# Patient Record
Sex: Male | Born: 1955 | Race: White | Hispanic: No | Marital: Married | Smoking: Former smoker
Health system: Southern US, Community
[De-identification: ages and names within clinical notes are randomized; demographics above are authoritative.]

## PROBLEM LIST (undated history)

## (undated) DIAGNOSIS — B1920 Unspecified viral hepatitis C without hepatic coma: Secondary | ICD-10-CM

## (undated) DIAGNOSIS — K219 Gastro-esophageal reflux disease without esophagitis: Secondary | ICD-10-CM

## (undated) DIAGNOSIS — T7840XA Allergy, unspecified, initial encounter: Secondary | ICD-10-CM

## (undated) DIAGNOSIS — K5190 Ulcerative colitis, unspecified, without complications: Secondary | ICD-10-CM

## (undated) HISTORY — PX: KNEE ARTHROSCOPY: SUR90

## (undated) HISTORY — DX: Allergy, unspecified, initial encounter: T78.40XA

## (undated) HISTORY — DX: Ulcerative colitis, unspecified, without complications: K51.90

## (undated) HISTORY — PX: SHOULDER SURGERY: SHX246

## (undated) HISTORY — DX: Unspecified viral hepatitis C without hepatic coma: B19.20

## (undated) HISTORY — DX: Gastro-esophageal reflux disease without esophagitis: K21.9

## (undated) HISTORY — PX: COLONOSCOPY: SHX174

---

## 1999-12-14 ENCOUNTER — Encounter: Payer: Self-pay | Admitting: Gastroenterology

## 1999-12-14 ENCOUNTER — Ambulatory Visit (HOSPITAL_COMMUNITY): Admission: RE | Admit: 1999-12-14 | Discharge: 1999-12-14 | Payer: Self-pay | Admitting: Gastroenterology

## 2000-05-10 DIAGNOSIS — K5190 Ulcerative colitis, unspecified, without complications: Secondary | ICD-10-CM

## 2000-05-10 HISTORY — DX: Ulcerative colitis, unspecified, without complications: K51.90

## 2001-01-12 ENCOUNTER — Ambulatory Visit (HOSPITAL_COMMUNITY): Admission: RE | Admit: 2001-01-12 | Discharge: 2001-01-12 | Payer: Self-pay | Admitting: Gastroenterology

## 2003-08-26 ENCOUNTER — Emergency Department (HOSPITAL_COMMUNITY): Admission: EM | Admit: 2003-08-26 | Discharge: 2003-08-26 | Payer: Self-pay | Admitting: Emergency Medicine

## 2005-03-29 ENCOUNTER — Ambulatory Visit: Payer: Self-pay | Admitting: Gastroenterology

## 2008-02-13 ENCOUNTER — Ambulatory Visit: Payer: Self-pay | Admitting: Gastroenterology

## 2008-03-05 ENCOUNTER — Ambulatory Visit: Payer: Self-pay | Admitting: Gastroenterology

## 2012-02-04 ENCOUNTER — Telehealth: Payer: Self-pay | Admitting: Gastroenterology

## 2012-02-04 NOTE — Telephone Encounter (Signed)
Pt states that for the past 3 weeks he has had rectal bleeding. States that right after he eats he has to run to the bathroom. Pt had a bowel movement today and states the toilet bowl was colored red. States he is scared and wants to be seen sooner than 1st available. Pt scheduled to see Mike Gip PA Monday 02/07/12 @9am . Pt aware of appt date and time.

## 2012-02-07 ENCOUNTER — Ambulatory Visit (INDEPENDENT_AMBULATORY_CARE_PROVIDER_SITE_OTHER): Payer: BC Managed Care – PPO | Admitting: Physician Assistant

## 2012-02-07 ENCOUNTER — Encounter: Payer: Self-pay | Admitting: Gastroenterology

## 2012-02-07 ENCOUNTER — Other Ambulatory Visit (INDEPENDENT_AMBULATORY_CARE_PROVIDER_SITE_OTHER): Payer: BC Managed Care – PPO

## 2012-02-07 ENCOUNTER — Encounter: Payer: Self-pay | Admitting: Physician Assistant

## 2012-02-07 VITALS — BP 98/70 | HR 80 | Ht 66.5 in | Wt 167.4 lb

## 2012-02-07 DIAGNOSIS — K5190 Ulcerative colitis, unspecified, without complications: Secondary | ICD-10-CM

## 2012-02-07 DIAGNOSIS — B1920 Unspecified viral hepatitis C without hepatic coma: Secondary | ICD-10-CM

## 2012-02-07 DIAGNOSIS — R109 Unspecified abdominal pain: Secondary | ICD-10-CM

## 2012-02-07 DIAGNOSIS — K625 Hemorrhage of anus and rectum: Secondary | ICD-10-CM

## 2012-02-07 LAB — COMPREHENSIVE METABOLIC PANEL
ALT: 47 U/L (ref 0–53)
BUN: 15 mg/dL (ref 6–23)
CO2: 27 mEq/L (ref 19–32)
Calcium: 9.3 mg/dL (ref 8.4–10.5)
Chloride: 106 mEq/L (ref 96–112)
Creatinine, Ser: 1.1 mg/dL (ref 0.4–1.5)
GFR: 76.7 mL/min (ref >60.00)
Glucose, Bld: 97 mg/dL (ref 70–99)
Total Bilirubin: 0.7 mg/dL (ref 0.3–1.2)

## 2012-02-07 LAB — HIGH SENSITIVITY CRP: CRP, High Sensitivity: 0.48 mg/dL (ref 0.000–5.000)

## 2012-02-07 LAB — CBC WITH DIFFERENTIAL/PLATELET
Basophils Absolute: 0 10*3/uL (ref 0.0–0.1)
Basophils Relative: 0.4 % (ref 0.0–3.0)
Eosinophils Relative: 5.8 % — ABNORMAL HIGH (ref 0.0–5.0)
HCT: 48 % (ref 39.0–52.0)
Hemoglobin: 16.1 g/dL (ref 13.0–17.0)
Lymphocytes Relative: 23 % (ref 12.0–46.0)
Lymphs Abs: 2.2 10*3/uL (ref 0.7–4.0)
Monocytes Relative: 8.5 % (ref 3.0–12.0)
Neutro Abs: 5.9 10*3/uL (ref 1.4–7.7)
RBC: 5.14 Mil/uL (ref 4.22–5.81)
RDW: 13.1 % (ref 11.5–14.6)
WBC: 9.5 10*3/uL (ref 4.5–10.5)

## 2012-02-07 MED ORDER — DICYCLOMINE HCL 10 MG PO CAPS: ORAL_CAPSULE | ORAL | Status: DC

## 2012-02-07 MED ORDER — MOVIPREP 100 G PO SOLR: 1.0000 | Freq: Once | ORAL | Status: AC

## 2012-02-07 NOTE — Progress Notes (Signed)
I agree with the plan outlined in this note 

## 2012-02-07 NOTE — Progress Notes (Signed)
Subjective:    Patient ID: Anthony Buck, male    DOB: 1955/08/25, 56 y.o.   MRN: [id number]  HPI Anthony Buck is a pleasant 56 year old white male new to [facility] today. He currently does not have a primary care physician, had been seen at [facility] family practice in the past. He relates a previous colonoscopy done in 2002 and thought that Dr. Arlyce Dice may have done this. We have obtained copies of those records and he had colonoscopy in 2002 by Dr. Ewing Schlein for complaints of rectal bleeding and abdominal discomfort and concerns for ulcerative colitis. Biopsies from that procedure showed unremarkable small bowel biopsies biopsies from the right and descending colon showed unremarkable colonic mucosa with no active or chronic inflammation and then the third set of colon biopsies reads chronic active gastritis consistent with inflammatory bowel disease. Further details from those biopsies show diffuse chronic colitis. At the time of the colonoscopy there was left-sided moderate inflammation noted. The patient does not remember what medication he was given nor how long he took medications but says his symptoms cleared and he has not had any issues since then. His current symptoms started about 3 months ago and have progressed somewhat. He says he feels fatigued and has had ongoing problems with abdominal cramping and urgency and left-sided abdominal discomfort. His appetite has been fine but he thinks his weight is down a few pounds. He has not had any fever or chills. He is now having several bowel movements per day up to 10 per day sometimes very small volume and most of his bowel movements are diarrheal and contain bright red blood. He had not been on any new medications or antibiotics though he had taken some supplements earlier in the summer which she has since stopped.  Patient also relates that he has been told that he has hepatitis C Apparently he did have acute hepatitis while he was in the [employer] in late  70s and then was diagnosis hepatitis C in 1998 when he was turned down for life insurance.  Last had labs done about 3 years ago and did have one appointment with the hepatitis C clinic here in [place] but decided at that time not to go ahead with treatment because of all of the potential side effects. He was concerned that he may not be able to work during this period of time and could not afford to the treatment affect his work.    Review of Systems  Constitutional: Positive for fatigue.  HENT: Negative.   Eyes: Negative.   Respiratory: Negative.   Cardiovascular: Negative.   Gastrointestinal: Positive for abdominal pain, diarrhea and blood in stool.  Genitourinary: Negative.   Musculoskeletal: Negative.   Neurological: Negative.   Hematological: Negative.   Psychiatric/Behavioral: Negative.     Outpatient Encounter Prescriptions as of 02/07/2012  Medication Sig Dispense Refill  . loratadine (CLARITIN) 10 MG tablet Take 10 mg by mouth daily.      . Multiple Vitamin (MULTIVITAMIN) tablet Take 1 tablet by mouth daily.      Marland Kitchen dicyclomine (BENTYL) 10 MG capsule Take 1 tab 3 times daily as needed for cramping and spasms.  90 capsule  1  . MOVIPREP 100 G SOLR Take 1 kit (100 g total) by mouth once.  1 kit  0     Allergies  Allergen Reactions  . Codeine Nausea Only    Patient Active Problem List  Diagnosis  . Hepatitis C   History   Social History  .  Marital Status: Single    Spouse Name: N/A    Number of Children: 3  . Years of Education: N/A   Occupational History  . insurance sales    Social History Main Topics  . Smoking status: Never Smoker   . Smokeless tobacco: Never Used  . Alcohol Use: No     rarely  . Drug Use: No  . Sexually Active: Not on file   Other Topics Concern  . Not on file   Social History Narrative  . No narrative on file       Objective:   Physical Exam well-developed white male in no acute distress, pleasant blood pressure 98/70  pulse 80 height 5 foot 6 weight 167. HEENT; nontraumatic normocephalic EOMI PERRLA sclera anicteric,Neck; Supple no JVD, Cardiovascular; regular rate and rhythm with S1-S2 no murmur or gallop, Pulmonary; clear bilaterally, Abdomen; soft bowel sounds are active he is tender in the left upper left mid and left lower quadrant no guarding no rebound no palpable mass or hepatosplenomegaly, Rectal; exam not done, Extremities; no clubbing cyanosis or edema skin warm and dry, Psych; mood and affect normal and appropriate.        Assessment & Plan:  #32  56 year old male with 2-3 month history of abdominal cramping left-sided abdominal pain diarrhea and rectal bleeding most consistent with reactivation of ulcerative colitis. Interestingly patient apparently has not had any symptoms over the past 11 years, since last colonoscopy. #2 apparent history of hepatitis C not treated  Plan; we'll check CBC with differential  CMET, CRP and hepatitis C DNA PCR Quant today Schedule for colonoscopy with Dr. Christella Hartigan as soon as possible, procedure was discussed in detail with the patient and he is agreeable to proceed. He may continue Imodium as needed, and we'll send a prescription for Bentyl 10 mg 3 times daily as needed for cramping Will hold off on any other treatment until colonoscopy. We did discuss treatment for his hepatitis C, and he would like to be referred again to the hepatitis C clinic. I told him he may need to travel to [facility] to be seen in the hepatitis C clinic there and he is willing to do so. Will wait for labs and results of colonoscopy, and if appropriate get him referred to the hepatitis C clinic.

## 2012-02-07 NOTE — Patient Instructions (Addendum)
Please go to the basement level to have your labs drawn.  We scheduled the colonoscopy with Dr Rob Bunting for tomorrow 02-08-2012. We printed prescriptions for the Moviprep and Bentyl ( Dicyclomine).

## 2012-02-08 ENCOUNTER — Encounter: Payer: Self-pay | Admitting: Gastroenterology

## 2012-02-08 ENCOUNTER — Encounter: Payer: Self-pay | Admitting: Physician Assistant

## 2012-02-08 ENCOUNTER — Ambulatory Visit (AMBULATORY_SURGERY_CENTER): Payer: BC Managed Care – PPO | Admitting: Gastroenterology

## 2012-02-08 VITALS — BP 142/89 | HR 74 | Temp 98.2°F | Resp 17 | Ht 66.5 in | Wt 167.0 lb

## 2012-02-08 DIAGNOSIS — K5289 Other specified noninfective gastroenteritis and colitis: Secondary | ICD-10-CM

## 2012-02-08 DIAGNOSIS — D126 Benign neoplasm of colon, unspecified: Secondary | ICD-10-CM

## 2012-02-08 DIAGNOSIS — K529 Noninfective gastroenteritis and colitis, unspecified: Secondary | ICD-10-CM

## 2012-02-08 DIAGNOSIS — K625 Hemorrhage of anus and rectum: Secondary | ICD-10-CM

## 2012-02-08 DIAGNOSIS — R109 Unspecified abdominal pain: Secondary | ICD-10-CM

## 2012-02-08 LAB — HEPATITIS C RNA QUANTITATIVE: HCV Quantitative Log: 5.98 {Log} — ABNORMAL HIGH (ref <1.18)

## 2012-02-08 MED ORDER — PREDNISONE 10 MG PO TABS: 40.0000 mg | ORAL_TABLET | Freq: Every day | ORAL | Status: DC

## 2012-02-08 MED ORDER — SODIUM CHLORIDE 0.9 % IV SOLN: 500.0000 mL | INTRAVENOUS | Status: DC

## 2012-02-08 NOTE — Progress Notes (Signed)
Very poor prep, incomplete exam

## 2012-02-08 NOTE — Patient Instructions (Addendum)
Discharge instructions given with verbal understanding. Handout on ulcerative Colitis given. Resume previous medications. YOU HAD AN ENDOSCOPIC PROCEDURE TODAY AT THE [facility]: Refer to the procedure report that was given to you for any specific questions about what was found during the examination.  If the procedure report does not answer your questions, please call your gastroenterologist to clarify.  If you requested that your care partner not be given the details of your procedure findings, then the procedure report has been included in a sealed envelope for you to review at your convenience later.  YOU SHOULD EXPECT: Some feelings of bloating in the abdomen. Passage of more gas than usual.  Walking can help get rid of the air that was put into your GI tract during the procedure and reduce the bloating. If you had a lower endoscopy (such as a colonoscopy or flexible sigmoidoscopy) you may notice spotting of blood in your stool or on the toilet paper. If you underwent a bowel prep for your procedure, then you may not have a normal bowel movement for a few days.  DIET: Your first meal following the procedure should be a light meal and then it is ok to progress to your normal diet.  A half-sandwich or bowl of soup is an example of a good first meal.  Heavy or fried foods are harder to digest and may make you feel nauseous or bloated.  Likewise meals heavy in dairy and vegetables can cause extra gas to form and this can also increase the bloating.  Drink plenty of fluids but you should avoid alcoholic beverages for 24 hours.  ACTIVITY: Your care partner should take you home directly after the procedure.  You should plan to take it easy, moving slowly for the rest of the day.  You can resume normal activity the day after the procedure however you should NOT DRIVE or use heavy machinery for 24 hours (because of the sedation medicines used during the test).    SYMPTOMS TO REPORT  IMMEDIATELY: A gastroenterologist can be reached at any hour.  During normal business hours, 8:30 AM to 5:00 PM Monday through Friday, call [phone].  After hours and on weekends, please call the GI answering service at [phone] who will take a message and have the physician on call contact you.   Following lower endoscopy (colonoscopy or flexible sigmoidoscopy):  Excessive amounts of blood in the stool  Significant tenderness or worsening of abdominal pains  Swelling of the abdomen that is new, acute  Fever of 100F or higher  FOLLOW UP: If any biopsies were taken you will be contacted by phone or by letter within the next 1-3 weeks.  Call your gastroenterologist if you have not heard about the biopsies in 3 weeks.  Our staff will call the home number listed on your records the next business day following your procedure to check on you and address any questions or concerns that you may have at that time regarding the information given to you following your procedure. This is a courtesy call and so if there is no answer at the home number and we have not heard from you through the emergency physician on call, we will assume that you have returned to your regular daily activities without incident.  SIGNATURES/CONFIDENTIALITY: You and/or your care partner have signed paperwork which will be entered into your electronic medical record.  These signatures attest to the fact that that the information above on your After Visit Summary  has been reviewed and is understood.  Full responsibility of the confidentiality of this discharge information lies with you and/or your care-partner.  

## 2012-02-08 NOTE — Op Note (Signed)
[facility] [address] [place], [address]   COLONOSCOPY PROCEDURE REPORT  PATIENT: Anthony, Buck  MR#: [id number] BIRTHDATE: [birth year] , 56  yrs. old GENDER: Male ENDOSCOPIST: Rachael Fee, MD REFERRED BY: PROCEDURE DATE:  02/08/2012 PROCEDURE:   Colonoscopy with biopsy ASA CLASS:   Class III INDICATIONS:colitis about 10 years ago (left sided, Dr.  Ewing Schlein), patient unsure how it was treated but has been on no meds for many years.  Several weeks of cramping, bloody diarrhea, left sided abd pains. MEDICATIONS: Fentanyl 50 mcg IV, Versed 6 mg IV, and These medications were titrated to patient response per physician's verbal order  DESCRIPTION OF PROCEDURE:   After the risks benefits and alternatives of the procedure were thoroughly explained, informed consent was obtained.  A digital rectal exam revealed no rectal mass.   The LB [id number]-[id number]  endoscope was introduced through the anus and advanced to the mid transverse colon. No adverse events experienced.   The quality of the prep was poor.  The instrument was then slowly withdrawn as the colon was fully examined.     COLON FINDINGS: This was an incomplete examination due to poor prep. The left colon was moderate to severely inflammed to approximately the transverse colon where there was a distinct transition to normal colon mucosa.  The poor prep limited my ability to complete the examination (could not view right colon or TI).  Biopsies taken from normal transverse and inflammed left colon, sent to pathology separately.  Retroflexion was not performed   .  The scope was withdrawn and the procedure completed. COMPLICATIONS: There were no complications.  ENDOSCOPIC IMPRESSION: This was an incomplete examination due to poor prep.  There was left sided colitis.  Biopsies taken from abnormal left colon, normal transverse colon.  RECOMMENDATIONS: New prescription called in for prednisone,  please start taking 40 mg once daily (4 pills once daily) until you return to see Dr.  Christella Hartigan in office.  The office will call you to set this up (2-3 weeks from now).   eSigned:  Rachael Fee, MD 02/08/2012 2:43 PM

## 2012-02-08 NOTE — Progress Notes (Signed)
Patient did not experience any of the following events: a burn prior to discharge; a fall within the facility; wrong site/side/patient/procedure/implant event; or a hospital transfer or hospital admission upon discharge from the facility. (G8907) Patient did not have preoperative order for IV antibiotic SSI prophylaxis. (G8918)  

## 2012-02-09 ENCOUNTER — Telehealth: Payer: Self-pay | Admitting: *Deleted

## 2012-02-09 NOTE — Telephone Encounter (Signed)
  Follow up Call-  Call back number 02/08/2012  Post procedure Call Back phone  # [phone]  Permission to leave phone message Yes     Patient questions:  Do you have a fever, pain , or abdominal swelling? no Pain Score  0 *  Have you tolerated food without any problems? yes  Have you been able to return to your normal activities? yes  Do you have any questions about your discharge instructions: Diet   no Medications  no Follow up visit  no  Do you have questions or concerns about your Care? no  Actions: * If pain score is 4 or above: No action needed, pain <4.

## 2012-02-21 ENCOUNTER — Telehealth: Payer: Self-pay

## 2012-02-21 DIAGNOSIS — B182 Chronic viral hepatitis C: Secondary | ICD-10-CM

## 2012-02-21 NOTE — Telephone Encounter (Signed)
Yes, thanks

## 2012-02-21 NOTE — Telephone Encounter (Signed)
Pt called and will be in next week to have labs

## 2012-02-21 NOTE — Telephone Encounter (Signed)
Labs in EPIC

## 2012-02-21 NOTE — Telephone Encounter (Signed)
Dr Christella Hartigan the [facility] clinic wants a Hep C genotype and Hep B surface antigen ordered, is this ok?

## 2012-02-28 ENCOUNTER — Other Ambulatory Visit: Payer: BC Managed Care – PPO

## 2012-02-28 DIAGNOSIS — B182 Chronic viral hepatitis C: Secondary | ICD-10-CM

## 2012-02-29 ENCOUNTER — Other Ambulatory Visit: Payer: Self-pay | Admitting: Gastroenterology

## 2012-02-29 LAB — HEPATITIS B SURFACE ANTIGEN: Hepatitis B Surface Ag: NEGATIVE

## 2012-03-03 LAB — HEPATITIS C GENOTYPE: HCV Genotype: 2

## 2012-03-10 ENCOUNTER — Encounter: Payer: Self-pay | Admitting: Gastroenterology

## 2012-03-10 ENCOUNTER — Ambulatory Visit (INDEPENDENT_AMBULATORY_CARE_PROVIDER_SITE_OTHER): Payer: BC Managed Care – PPO | Admitting: Gastroenterology

## 2012-03-10 ENCOUNTER — Other Ambulatory Visit (INDEPENDENT_AMBULATORY_CARE_PROVIDER_SITE_OTHER): Payer: BC Managed Care – PPO

## 2012-03-10 VITALS — BP 112/84 | HR 96 | Ht 66.5 in | Wt 165.4 lb

## 2012-03-10 DIAGNOSIS — K5190 Ulcerative colitis, unspecified, without complications: Secondary | ICD-10-CM

## 2012-03-10 LAB — COMPREHENSIVE METABOLIC PANEL
Alkaline Phosphatase: 57 U/L (ref 39–117)
CO2: 29 mEq/L (ref 19–32)
Creatinine, Ser: 1 mg/dL (ref 0.4–1.5)
GFR: 81.07 mL/min (ref >60.00)
Glucose, Bld: 131 mg/dL — ABNORMAL HIGH (ref 70–99)
Sodium: 137 mEq/L (ref 135–145)
Total Bilirubin: 0.9 mg/dL (ref 0.3–1.2)
Total Protein: 7.5 g/dL (ref 6.0–8.3)

## 2012-03-10 LAB — CBC WITH DIFFERENTIAL/PLATELET
Eosinophils Relative: 0 % (ref 0.0–5.0)
HCT: 49.2 % (ref 39.0–52.0)
Hemoglobin: 16.3 g/dL (ref 13.0–17.0)
Lymphs Abs: 0.8 10*3/uL (ref 0.7–4.0)
MCV: 95.4 fl (ref 78.0–100.0)
Monocytes Absolute: 0.2 10*3/uL (ref 0.1–1.0)
Neutro Abs: 9.9 10*3/uL — ABNORMAL HIGH (ref 1.4–7.7)
Platelets: 309 10*3/uL (ref 150.0–400.0)
RDW: 13.7 % (ref 11.5–14.6)
WBC: 10.9 10*3/uL — ABNORMAL HIGH (ref 4.5–10.5)

## 2012-03-10 MED ORDER — MESALAMINE 1.2 G PO TBEC: 1200.0000 mg | DELAYED_RELEASE_TABLET | Freq: Every day | ORAL | Status: DC

## 2012-03-10 NOTE — Patient Instructions (Addendum)
Start Lialda 4 pills once daily. Start tapering prednisone by 5mg  every week until completely off. Return to see Dr. Christella Hartigan in 6-7 weeks, sooner if needed. Avoid excessive NSAIDs. You will have labs checked today in the basement lab.  Please head down after you check out with the front desk  (cmet, cbc). We will get in touch with [facility] clinic again to check on appointments.

## 2012-03-10 NOTE — Progress Notes (Signed)
Review of pertinent gastrointestinal problems: 1. Ulcerative colitis:  He was told he had colitis around 2000 by Dr. Ewing Schlein;  recent bleeding, diarrhea lead to colonoscopy October 2013 (jacobs).  Incomplete colonoscopy due to inflammation, poor prep. He did have moderate left-sided colitis that ended around the splenic flexure. Biopsies of normal-appearing transverse colon were normal, biopsies of inflamed appearing left colon showed chronic inflammation. He started prednisone 40 mg once daily.   HPI: This is a very pleasant 56-year-old man whom I last saw about a month ago at the time of colonoscopy.   Sleep altered a bit.  Bleeding is completely gone.  He is having 2-3 solid stools per day, which is pretty normal for him.  Eating well.    He has questions about his hepatitis C.   Past Medical History  Diagnosis Date  . Ulcerative colitis 2002  . Hepatitis C   . Allergy     SEASONAL  . GERD (gastroesophageal reflux disease)     Past Surgical History  Procedure Date  . Shoulder surgery   . Colonoscopy     Current Outpatient Prescriptions  Medication Sig Dispense Refill  . dicyclomine (BENTYL) 10 MG capsule Take 10 mg by mouth daily. Per pt      . loratadine (CLARITIN) 10 MG tablet Take 10 mg by mouth daily.      . Multiple Vitamin (MULTIVITAMIN) tablet Take 1 tablet by mouth daily.      . predniSONE (DELTASONE) 10 MG tablet Take 4 tablets (40 mg total) by mouth daily.  120 tablet  3  . DISCONTD: dicyclomine (BENTYL) 10 MG capsule Take 1 tab 3 times daily as needed for cramping and spasms.  90 capsule  1    Allergies as of 03/10/2012 - Review Complete 03/10/2012  Allergen Reaction Noted  . Codeine Nausea Only 02/07/2012    Family History  Problem Relation Age of Onset  . Hypertension Mother     History   Social History  . Marital Status: Single    Spouse Name: N/A    Number of Children: 3  . Years of Education: N/A   Occupational History  . insurance sales     Social History Main Topics  . Smoking status: Never Smoker   . Smokeless tobacco: Never Used  . Alcohol Use: No     rarely  . Drug Use: No  . Sexually Active: Not on file   Other Topics Concern  . Not on file   Social History Narrative  . No narrative on file      Physical Exam: BP 112/84  Pulse 96  Ht 5' 6.5" (1.689 m)  Wt 165 lb 6 oz (75.014 kg)  BMI 26.29 kg/m2 Constitutional: generally well-appearing Psychiatric: alert and oriented x3 Abdomen: soft, nontender, nondistended, no obvious ascites, no peritoneal signs, normal bowel sounds     Assessment and plan: 55 y.o. male with ulcerative colitis, left-sided  His symptoms are under good control since he started prednisone 40 mg once daily. Beginning tomorrow he will be tapering by 5 mg per week. He will start mesalamine 4.8 g daily beginning tomorrow as well. I've written a prescription. He'll return to see me in 6-7 weeks or sooner if needed.

## 2012-03-13 ENCOUNTER — Other Ambulatory Visit: Payer: Self-pay

## 2012-03-13 ENCOUNTER — Telehealth: Payer: Self-pay | Admitting: Gastroenterology

## 2012-03-13 DIAGNOSIS — R7989 Other specified abnormal findings of blood chemistry: Secondary | ICD-10-CM

## 2012-03-13 MED ORDER — MESALAMINE 800 MG PO TBEC: 3.0000 | DELAYED_RELEASE_TABLET | Freq: Two times a day (BID) | ORAL | Status: DC

## 2012-03-13 NOTE — Telephone Encounter (Signed)
Yes, i cancelled the lialda and wrote him for asacol 800mg  pills, 3 pills twice daily.  Have him try those instead.  Thanks

## 2012-03-13 NOTE — Telephone Encounter (Signed)
Pt aware and will call with any further questions or concerns

## 2012-03-13 NOTE — Telephone Encounter (Signed)
Message copied by Rachael Fee on Mon Mar 13, 2012  1:01 PM ------      Message from: Donata Duff      Created: Mon Mar 13, 2012  9:11 AM       Pt aware and labs to be done before the next ROV.  Pt also would like Dr Christella Hartigan to be aware that he can not afford Lialda it is $700 per month.  Can he try something else?

## 2012-03-14 ENCOUNTER — Telehealth: Payer: Self-pay | Admitting: Gastroenterology

## 2012-03-14 NOTE — Telephone Encounter (Signed)
Left message on machine to call back  

## 2012-03-14 NOTE — Telephone Encounter (Signed)
Spoke to patient who said both Asacol and Lialda were over $700.00.  He was advised to call his insurance company and ask what mesalamine they will cover and call us back.

## 2012-04-24 ENCOUNTER — Telehealth: Payer: Self-pay

## 2012-04-24 NOTE — Telephone Encounter (Signed)
Message copied by Donata Duff on Mon Apr 24, 2012  9:17 AM ------      Message from: Donata Duff      Created: Mon Mar 13, 2012  9:09 AM       Pt to get labs

## 2012-04-24 NOTE — Telephone Encounter (Signed)
Labs have been complete

## 2012-04-25 ENCOUNTER — Ambulatory Visit: Payer: BC Managed Care – PPO | Admitting: Gastroenterology

## 2014-11-20 ENCOUNTER — Ambulatory Visit (INDEPENDENT_AMBULATORY_CARE_PROVIDER_SITE_OTHER): Payer: BLUE CROSS/BLUE SHIELD | Admitting: Adult Health

## 2014-11-20 ENCOUNTER — Encounter: Payer: Self-pay | Admitting: Adult Health

## 2014-11-20 VITALS — BP 140/100 | Temp 98.2°F | Ht 66.0 in | Wt 167.8 lb

## 2014-11-20 DIAGNOSIS — Z7189 Other specified counseling: Secondary | ICD-10-CM | POA: Diagnosis not present

## 2014-11-20 DIAGNOSIS — R5383 Other fatigue: Secondary | ICD-10-CM | POA: Diagnosis not present

## 2014-11-20 DIAGNOSIS — Z7689 Persons encountering health services in other specified circumstances: Secondary | ICD-10-CM

## 2014-11-20 DIAGNOSIS — G479 Sleep disorder, unspecified: Secondary | ICD-10-CM

## 2014-11-20 DIAGNOSIS — K219 Gastro-esophageal reflux disease without esophagitis: Secondary | ICD-10-CM | POA: Insufficient documentation

## 2014-11-20 DIAGNOSIS — K2100 Gastro-esophageal reflux disease with esophagitis, without bleeding: Secondary | ICD-10-CM

## 2014-11-20 DIAGNOSIS — Z23 Encounter for immunization: Secondary | ICD-10-CM

## 2014-11-20 NOTE — Patient Instructions (Signed)
It was great meeting you today!   Start taking over the counter Omeprazole for your acid reflux and Melatonin 3 mg for your sleep issues.   Make an appointment to follow up in one month for a complete physical. Please do not eat anything after midnight the night before.   Start exercising and eating healthy. I would like to get your blood pressure down.   Let me know if I can do anything for you.

## 2014-11-20 NOTE — Progress Notes (Signed)
HPI:  Anthony Buck is here to establish care.  Last PCP and physical:Unknown  Has the following chronic problems that require follow up and concerns today:  Fatigue - For three or four months. Feels as though he does not have the energy he used to have or he should have. Denies ED. He endorses about 5 hours of sleep a night. Feels as though it is a restless sleep.   Anthony Buck - He uses Tums, which helps right away but does not last. Gets occasional pain in upper esophagus after laying down. He has started sleeping on pillows to keep the pain from happening. He does endorse sour taste in his mouth on occasion.    ROS negative for unless reported above: fevers, chills,feeling poorly, unintentional weight loss, hearing or vision loss, chest pain, palpitations, leg claudication, struggling to breath,Not feeling congested in the chest, no orthopenia, no cough,no wheezing, normal appetite, no soft tissue swelling, no hemoptysis, melena, hematochezia, hematuria, falls, loc, si, or thoughts of self harm.  Immunizations:UTD Diet: Tries and eats healthy. Does not follow any diet Exercise: Does not exercise.  Colonoscopy: 2013- Normal  Eye: Does not see . Wears reading glasses Dentist: Rarely    Past Medical History  Diagnosis Date  . Ulcerative colitis 2002  . Hepatitis C   . Allergy     SEASONAL  . GERD (gastroesophageal reflux disease)   . Colitis     Past Surgical History  Procedure Laterality Date  . Shoulder surgery    . Colonoscopy      Family History  Problem Relation Age of Onset  . Hypertension Mother     History   Social History  . Marital Status: Single    Spouse Name: N/A  . Number of Children: 3  . Years of Education: N/A   Occupational History  . insurance sales    Social History Main Topics  . Smoking status: Never Smoker   . Smokeless tobacco: Never Used  . Alcohol Use: 0.0 oz/week    0 Standard drinks or equivalent per week     Comment: rarely'  glass of wine once or twice on weekend   . Drug Use: No  . Sexual Activity: Not on file   Other Topics Concern  . None   Social History Narrative     Current outpatient prescriptions:  .  loratadine (CLARITIN) 10 MG tablet, Take 10 mg by mouth., Disp: , Rfl:   EXAM:  Filed Vitals:   11/20/14 1013  BP: 140/100  Temp: 98.2 F (36.8 C)    Body mass index is 27.1 kg/(m^2).  GENERAL: vitals reviewed and listed above, alert, oriented, appears well hydrated and in no acute distress  HEENT: atraumatic, conjunttiva clear, no obvious abnormalities on inspection of external nose and ears  NECK: Neck is soft and supple without masses, no adenopathy or thyromegaly, trachea midline, no JVD. Normal range of motion.   LUNGS: clear to auscultation bilaterally, no wheezes, rales or rhonchi, good air movement  CV: Regular rate and rhythm, normal S1/S2, no audible murmurs, gallops, or rubs. No carotid bruit and no peripheral edema.   MS: moves all extremities without noticeable abnormality. No edema noted  Abd: soft/nontender/nondistended/normal bowel sounds   Skin: warm and dry, no rash   Extremities: No clubbing, cyanosis, or edema. Capillary refill is WNL. Pulses intact bilaterally in upper and lower extremities.   Neuro: CN II-XII intact, sensation and reflexes normal throughout, 5/5 muscle strength in bilateral upper and  lower extremities. Normal finger to nose. Normal rapid alternating movements.   PSYCH: pleasant and cooperative, no obvious depression or anxiety  ASSESSMENT AND PLAN:  1. Encounter to establish care - Follow up in one month for CPE - Follow up sooner if needed - He is interested in Hep C treatment - will get him information on this.   2. Gastroesophageal reflux disease with esophagitis - OTC omeprazole - Follow up at CPE  3. Other fatigue - Likely due to his sleep issues.  - OTC Melatonin  - Will consider Testosterone lab on next visit.   4. Sleep  disturbance - Trial OTC melatonin   5. Need for tetanus booster - Tdap vaccine greater than or equal to 7yo IM   No diagnosis found. -We reviewed the PMH, PSH, FH, SH, Meds and Allergies. -We provided refills for any medications we will prescribe as needed. -We addressed current concerns per orders and patient instructions. -We have asked for records for pertinent exams, studies, vaccines and notes from previous providers. -We have advised patient to follow up per instructions below.   -Patient advised to return or notify a provider immediately if symptoms worsen or persist or new concerns arise.  There are no Patient Instructions on file for this visit.   [employer]

## 2014-12-19 ENCOUNTER — Other Ambulatory Visit (INDEPENDENT_AMBULATORY_CARE_PROVIDER_SITE_OTHER): Payer: BLUE CROSS/BLUE SHIELD

## 2014-12-19 DIAGNOSIS — Z0000 Encounter for general adult medical examination without abnormal findings: Secondary | ICD-10-CM | POA: Diagnosis not present

## 2014-12-19 LAB — CBC WITH DIFFERENTIAL/PLATELET
BASOS ABS: 0 10*3/uL (ref 0.0–0.1)
Basophils Relative: 0.6 % (ref 0.0–3.0)
Eosinophils Absolute: 0.2 10*3/uL (ref 0.0–0.7)
Eosinophils Relative: 2.9 % (ref 0.0–5.0)
HCT: 47.9 % (ref 39.0–52.0)
HEMOGLOBIN: 16.3 g/dL (ref 13.0–17.0)
Lymphocytes Relative: 27.9 % (ref 12.0–46.0)
Lymphs Abs: 1.8 10*3/uL (ref 0.7–4.0)
MCHC: 34.1 g/dL (ref 30.0–36.0)
MCV: 91.4 fl (ref 78.0–100.0)
Monocytes Absolute: 0.6 10*3/uL (ref 0.1–1.0)
Monocytes Relative: 10.1 % (ref 3.0–12.0)
NEUTROS PCT: 58.5 % (ref 43.0–77.0)
Neutro Abs: 3.7 10*3/uL (ref 1.4–7.7)
Platelets: 240 10*3/uL (ref 150.0–400.0)
RBC: 5.24 Mil/uL (ref 4.22–5.81)
RDW: 13.5 % (ref 11.5–15.5)
WBC: 6.4 10*3/uL (ref 4.0–10.5)

## 2014-12-19 LAB — LIPID PANEL
Cholesterol: 202 mg/dL — ABNORMAL HIGH (ref 0–200)
HDL: 49.3 mg/dL (ref >39.00)
LDL Cholesterol: 137 mg/dL — ABNORMAL HIGH (ref 0–99)
NonHDL: 152.95
TRIGLYCERIDES: 81 mg/dL (ref 0.0–149.0)
Total CHOL/HDL Ratio: 4
VLDL: 16.2 mg/dL (ref 0.0–40.0)

## 2014-12-19 LAB — POCT URINALYSIS DIPSTICK
Bilirubin, UA: NEGATIVE
Blood, UA: NEGATIVE
Glucose, UA: NEGATIVE
Ketones, UA: NEGATIVE
Nitrite, UA: NEGATIVE
PROTEIN UA: NEGATIVE
SPEC GRAV UA: 1.01
UROBILINOGEN UA: 0.2
pH, UA: 6.5

## 2014-12-19 LAB — HEPATIC FUNCTION PANEL
ALBUMIN: 4.3 g/dL (ref 3.5–5.2)
ALT: 135 U/L — ABNORMAL HIGH (ref 0–53)
AST: 67 U/L — ABNORMAL HIGH (ref 0–37)
Alkaline Phosphatase: 56 U/L (ref 39–117)
Bilirubin, Direct: 0.2 mg/dL (ref 0.0–0.3)
Total Bilirubin: 0.6 mg/dL (ref 0.2–1.2)
Total Protein: 7.9 g/dL (ref 6.0–8.3)

## 2014-12-19 LAB — BASIC METABOLIC PANEL
BUN: 12 mg/dL (ref 6–23)
CALCIUM: 10.4 mg/dL (ref 8.4–10.5)
CO2: 30 mEq/L (ref 19–32)
CREATININE: 1.07 mg/dL (ref 0.40–1.50)
Chloride: 104 mEq/L (ref 96–112)
GFR: 75.12 mL/min (ref >60.00)
GLUCOSE: 110 mg/dL — AB (ref 70–99)
POTASSIUM: 5.2 meq/L — AB (ref 3.5–5.1)
Sodium: 143 mEq/L (ref 135–145)

## 2014-12-19 LAB — TSH: TSH: 3.09 u[IU]/mL (ref 0.35–4.50)

## 2014-12-19 LAB — PSA: PSA: 3.31 ng/mL (ref 0.10–4.00)

## 2014-12-26 ENCOUNTER — Ambulatory Visit (INDEPENDENT_AMBULATORY_CARE_PROVIDER_SITE_OTHER): Payer: BLUE CROSS/BLUE SHIELD | Admitting: Adult Health

## 2014-12-26 ENCOUNTER — Encounter: Payer: Self-pay | Admitting: Adult Health

## 2014-12-26 VITALS — BP 120/86 | Temp 97.8°F | Ht 66.0 in | Wt 167.5 lb

## 2014-12-26 DIAGNOSIS — B182 Chronic viral hepatitis C: Secondary | ICD-10-CM | POA: Diagnosis not present

## 2014-12-26 DIAGNOSIS — Z0000 Encounter for general adult medical examination without abnormal findings: Secondary | ICD-10-CM

## 2014-12-26 DIAGNOSIS — R200 Anesthesia of skin: Secondary | ICD-10-CM

## 2014-12-26 DIAGNOSIS — R202 Paresthesia of skin: Secondary | ICD-10-CM | POA: Diagnosis not present

## 2014-12-26 NOTE — Progress Notes (Addendum)
Subjective:    Patient ID: Anthony Buck, male    DOB: 06-Jul-1955, 59 y.o.   MRN: [id number]  HPI 59 year old male pleasant male with  has a past medical history of Ulcerative colitis (2002); Hepatitis C; Allergy; and GERD (gastroesophageal reflux disease)., who presents today for his complete physical.    During his last visit we spoke about the following problems.Marland Kitchen   1) [name] - He did a trial of OTC omeprazole, he took this for one month and has had no issues with GERD since.   2) Fatigue - He endorses having more energy since starting a new, healthier diet. He is sleeping better and endorses feeling well over all.   He has one new complaint of right hand tingling and numbness. He only has this sensation when he is riding his motorcycle or driving his car. This has been an ongoing issue and is quickly relieved by lowering his hand down to his side.   He goes to the dentist on occasion. Does not see a eye doctor.   He would like to see someone at the Hepatitis C clinic about starting Harm[place].   Review of Systems  Constitutional: Negative.   HENT: Negative.   Eyes: Negative.   Respiratory: Negative.   Cardiovascular: Negative.   Gastrointestinal: Negative.   Endocrine: Negative.   Genitourinary: Negative.   Musculoskeletal: Positive for myalgias (right scapular area). Negative for back pain, arthralgias and gait problem.  Skin: Negative.   Allergic/Immunologic: Negative.   Neurological: Negative.   Hematological: Negative.   Psychiatric/Behavioral: Negative.   All other systems reviewed and are negative.  Past Medical History  Diagnosis Date  . Ulcerative colitis 2002  . Hepatitis C   . Allergy     SEASONAL  . GERD (gastroesophageal reflux disease)     Social History   Social History  . Marital Status: Single    Spouse Name: N/A  . Number of Children: 3  . Years of Education: N/A   Occupational History  . insurance sales    Social History Main Topics  .  Smoking status: Former Research scientist (life sciences)  . Smokeless tobacco: Never Used  . Alcohol Use: 0.0 oz/week    0 Standard drinks or equivalent per week     Comment: rarely' glass of wine once or twice on weekend   . Drug Use: No  . Sexual Activity: Not on file   Other Topics Concern  . Not on file   Social History Narrative   [place]   Married with three boys, youngest is 59 years of age. All live locally.    Outdoor Set designer to play golf, fish, ride motorcycle.     Past Surgical History  Procedure Laterality Date  . Shoulder surgery    . Knee arthroscopy      Family History  Problem Relation Age of Onset  . Hypertension Mother     Allergies  Allergen Reactions  . Codeine Nausea Only    Current Outpatient Prescriptions on File Prior to Visit  Medication Sig Dispense Refill  . loratadine (CLARITIN) 10 MG tablet Take 10 mg by mouth.     No current facility-administered medications on file prior to visit.    BP 120/86 mmHg  Temp(Src) 97.8 F (36.6 C) (Oral)  Ht 5\' 6"  (1.676 m)  Wt 167 lb 8 oz (75.978 kg)  BMI 27.05 kg/m2       Objective:   Physical Exam  Constitutional:  He is oriented to person, place, and time. He appears well-developed and well-nourished. No distress.  HENT:  Head: Normocephalic and atraumatic.  Right Ear: External ear normal.  Left Ear: External ear normal.  Mouth/Throat: Oropharynx is clear and moist. No oropharyngeal exudate.  Eyes: Conjunctivae and EOM are normal. Pupils are equal, round, and reactive to light. Right eye exhibits no discharge. Left eye exhibits no discharge.  Neck: Normal range of motion. Neck supple. No tracheal deviation present. No thyromegaly present.  Cardiovascular: Normal rate, normal heart sounds and intact distal pulses.  Exam reveals no gallop.   No murmur heard. Pulmonary/Chest: Effort normal and breath sounds normal. No respiratory distress. He has no wheezes. He has no rales. He exhibits no  tenderness.  Abdominal: Soft. Bowel sounds are normal. He exhibits no distension and no mass. There is no tenderness. There is no rebound and no guarding.  Genitourinary: Rectum normal, prostate normal and penis normal. Guaiac negative stool. No penile tenderness.  Musculoskeletal: Normal range of motion. He exhibits tenderness (to right scapula). He exhibits no edema.  No discomfort when raising arm from side or from the front of his body. Is able to reach above his head without difficulty. No pain with full can or empty can test.   Pain with palpation to right shoulder blade. No popping, edema, or crepitus felt.   Lymphadenopathy:    He has no cervical adenopathy.  Neurological: He is alert and oriented to person, place, and time.  Skin: Skin is warm and dry. No rash noted. He is not diaphoretic. No erythema. No pallor.  Psychiatric: He has a normal mood and affect. His behavior is normal. Judgment and thought content normal.  Nursing note and vitals reviewed.      Assessment & Plan:  1. Routine general medical examination at a health care facility - Benign Exam - Follow up in one year for CPE - Follow up sooner if need  2. Chronic hepatitis C without hepatic coma - AMB referral to hepatitis C clinic  3. Numbness and tingling in right hand - Likely pinched nerve, less likely carpel tunnel or neuropathy.  - He will try massage therapy first - Does not want to try medication - Consider nerve conduction test  - Ibuprofen 600mg  as needed

## 2014-12-26 NOTE — Patient Instructions (Addendum)
It was great seeing you again today!  Please continue to work on your diet and this should correct the slightly elevated cholesterol.  Someone will call you to schedule your visit to the Hep C clinic.   You can follow up with me in 1 year for your next physical.

## 2014-12-26 NOTE — Addendum Note (Signed)
Addended by: Apolinar Junes on: 12/26/2014 09:45 AM   Modules accepted: Miquel Dunn

## 2015-01-02 ENCOUNTER — Telehealth: Payer: Self-pay | Admitting: Adult Health

## 2015-01-02 NOTE — Telephone Encounter (Signed)
Dawn a NP call from [facility] call to say that they have been trying to reach the patient and has been unsuccessful. They have sent the patient a letter

## 2015-01-02 NOTE — Telephone Encounter (Signed)
Anthony Buck aware.

## 2016-04-12 ENCOUNTER — Ambulatory Visit (INDEPENDENT_AMBULATORY_CARE_PROVIDER_SITE_OTHER): Payer: BLUE CROSS/BLUE SHIELD | Admitting: Physician Assistant

## 2016-04-12 ENCOUNTER — Encounter: Payer: Self-pay | Admitting: Physician Assistant

## 2016-04-12 ENCOUNTER — Other Ambulatory Visit (INDEPENDENT_AMBULATORY_CARE_PROVIDER_SITE_OTHER): Payer: BLUE CROSS/BLUE SHIELD

## 2016-04-12 ENCOUNTER — Telehealth: Payer: Self-pay | Admitting: *Deleted

## 2016-04-12 VITALS — BP 110/70 | HR 78 | Temp 98.7°F | Ht 67.0 in | Wt 168.0 lb

## 2016-04-12 DIAGNOSIS — R197 Diarrhea, unspecified: Secondary | ICD-10-CM

## 2016-04-12 DIAGNOSIS — K7460 Unspecified cirrhosis of liver: Secondary | ICD-10-CM

## 2016-04-12 DIAGNOSIS — K51911 Ulcerative colitis, unspecified with rectal bleeding: Secondary | ICD-10-CM

## 2016-04-12 DIAGNOSIS — K625 Hemorrhage of anus and rectum: Secondary | ICD-10-CM | POA: Diagnosis not present

## 2016-04-12 LAB — COMPREHENSIVE METABOLIC PANEL
ALBUMIN: 4.3 g/dL (ref 3.5–5.2)
ALK PHOS: 61 U/L (ref 39–117)
ALT: 34 U/L (ref 0–53)
AST: 25 U/L (ref 0–37)
BILIRUBIN TOTAL: 0.8 mg/dL (ref 0.2–1.2)
BUN: 14 mg/dL (ref 6–23)
CO2: 29 mEq/L (ref 19–32)
Calcium: 10 mg/dL (ref 8.4–10.5)
Chloride: 105 mEq/L (ref 96–112)
Creatinine, Ser: 1.09 mg/dL (ref 0.40–1.50)
GFR: 73.2 mL/min (ref >60.00)
GLUCOSE: 98 mg/dL (ref 70–99)
Potassium: 4.8 mEq/L (ref 3.5–5.1)
SODIUM: 139 meq/L (ref 135–145)
TOTAL PROTEIN: 7.6 g/dL (ref 6.0–8.3)

## 2016-04-12 LAB — CBC WITH DIFFERENTIAL/PLATELET
BASOS ABS: 0 10*3/uL (ref 0.0–0.1)
Basophils Relative: 0.4 % (ref 0.0–3.0)
Eosinophils Absolute: 0.1 10*3/uL (ref 0.0–0.7)
Eosinophils Relative: 1.6 % (ref 0.0–5.0)
HCT: 47 % (ref 39.0–52.0)
HEMOGLOBIN: 16 g/dL (ref 13.0–17.0)
LYMPHS ABS: 2.3 10*3/uL (ref 0.7–4.0)
Lymphocytes Relative: 26.8 % (ref 12.0–46.0)
MCHC: 34.2 g/dL (ref 30.0–36.0)
MCV: 89.9 fl (ref 78.0–100.0)
MONO ABS: 0.8 10*3/uL (ref 0.1–1.0)
MONOS PCT: 9.5 % (ref 3.0–12.0)
NEUTROS PCT: 61.7 % (ref 43.0–77.0)
Neutro Abs: 5.3 10*3/uL (ref 1.4–7.7)
Platelets: 276 10*3/uL (ref 150.0–400.0)
RBC: 5.22 Mil/uL (ref 4.22–5.81)
RDW: 13.2 % (ref 11.5–15.5)
WBC: 8.6 10*3/uL (ref 4.0–10.5)

## 2016-04-12 LAB — HIGH SENSITIVITY CRP: CRP, High Sensitivity: 0.34 mg/L (ref 0.000–5.000)

## 2016-04-12 LAB — SEDIMENTATION RATE: SED RATE: 19 mm/h (ref 0–20)

## 2016-04-12 MED ORDER — MESALAMINE 1.2 G PO TBEC: DELAYED_RELEASE_TABLET | ORAL | 11 refills | Status: DC

## 2016-04-12 MED ORDER — PREDNISONE 10 MG PO TABS: ORAL_TABLET | ORAL | 0 refills | Status: DC

## 2016-04-12 NOTE — Telephone Encounter (Signed)
Faxed form to [facility] for this patient.  Also faxed the office note from today, Amy Esterwood PA-C. Fax # [phone].

## 2016-04-12 NOTE — Progress Notes (Signed)
I agree with the above note, plan 

## 2016-04-12 NOTE — Patient Instructions (Signed)
We sent prescriptions to [facility].  1.Prednisone 20 mg 2. Lialda 1.2 g   We will call you with the appointment with the Hepatitis C Clinic.   We made you a follow up appointment with Dr. Owens Loffler for 06-01-2016 at 8:30   Presnisone 20 mg Take 2 tabs ( 20 mg ) x 14 days. Take 1 1/2 tab ( 15 mg ) x 14 days Take  1 tab ( 10 mg) x 14 days Take 1/2 tab ( 5 mg) x 14 days. Then stop.

## 2016-04-12 NOTE — Progress Notes (Signed)
Subjective:    Patient ID: Anthony Buck, male    DOB: [birth year], 60 y.o.   MRN: [id number]  HPI Anthony Buck is a pleasant 60 year old white male known to Dr. Ardis Hughs. He has diagnosis of left-sided ulcerative colitis and chronic hepatitis C. He was last seen in our office in 2013. He underwent colonoscopy in October 2013 with finding of a poor prep, he was noted to have moderate to severe left-sided colitis. Biopsies from the left colon showed minimally active colitis, the transverse colon biopsy showed no inflammation. He was treated initially with a course of prednisone and was to start a mesalamine but his wife thinks that he never took this medication because it was too expensive. He says he has done very well since that time and had no active symptoms until about 6 weeks ago. He says he started having problems with lower abdominal discomfort and bright red blood per rectum. He has had some progression of symptoms and says he is passing blood with almost every bowel movement at this point, his stools are diarrheal, perhaps 3-4 bowel movements per day and then has intermittent additional episodes of what sounds like tenesmus with urgency and passage of just blood. Appetite is been somewhat decreased weight is down a few pounds. He has not had any fever or chills. No new medications antibiotics etc. prior to exacerbation of symptoms. They had traveled to [place] for about 3 weeks prior to onset of his symptoms. Patient did have a referral to the hepatitis clinic a couple of years ago did not keep that appointment. He and his wife are concerned about what medications he can take in the setting of hepatitis C. Hep C RNA Quant 2013 = [phone]  Review of Systems Pertinent positive and negative review of systems were noted in the above HPI section.  All other review of systems was otherwise negative.  Outpatient Encounter Prescriptions as of 04/12/2016  Medication Sig  . loratadine (CLARITIN) 10 MG tablet Take  10 mg by mouth daily as needed.   . mesalamine (LIALDA) 1.2 g EC tablet Take 2 tabets twice daily  . predniSONE (DELTASONE) 10 MG tablet Take 20 mg ( 2 tab) x 14 days, take 15 mg ( 1 1/2 tabs) x 14 days, take 10 mg ( 1 tab)  x 14 days, take 5 mg (1/2 tab) x 14 days.   No facility-administered encounter medications on file as of 04/12/2016.    Allergies  Allergen Reactions  . Codeine Nausea Only   Patient Active Problem List   Diagnosis Date Noted  . Ulcerative colitis ([place]) 03/10/2012  . Hepatitis C 02/07/2012   Social History   Social History  . Marital status: Married    Spouse name: [name]  . Number of children: 3  . Years of education: N/A   Occupational History  . insurance sales    Social History Main Topics  . Smoking status: Former Smoker    Types: Cigarettes  . Smokeless tobacco: Never Used  . Alcohol use 0.0 oz/week     Comment: social  . Drug use: No  . Sexual activity: Not on file   Other Topics Concern  . Not on file   Social History Narrative   [place]   Married with three boys, youngest is 60 years of age. All live locally.       Likes to play golf, fish, ride motorcycle.     Mr. Du family history includes Alzheimer's disease in  his mother; Hypertension in his mother; Stroke in his mother.      Objective:    Vitals:   04/12/16 1036  BP: 110/70  Pulse: 78  Temp: 98.7 F (37.1 C)    Physical Exam  well-developed white male in no acute distress, accompanied by his wife blood pressure 110/70 pulse 78 temp 98 7, Height 5 foot 7, weight 168, BMI 26.3. HEENT; nontraumatic normocephalic EOMI PERRLA sclera anicteric, Cardiovascular regular rate and rhythm with S1-S2 no murmur or gallop, Pulmonary; clear bilaterally, Abdomen; soft he has some tenderness in the left upper and left mid quadrant and mildly in the left lower quadrant there is no guarding or rebound no palpable mass or hepatosplenomegaly, Rectal ;exam not done,  Extremities; no clubbing cyanosis or edema skin warm and dry, Neuropsych ;mood and affect appropriate       Assessment & Plan:   #42  60 year old male with known left-sided ulcerative colitis initially diagnosed around 2000. Last seen in 2013, and on no medications in the interval who presents with exacerbation of symptoms over the past 4-6 weeks with left-sided abdominal discomfort and loose to diarrheal stools and rectal bleeding and some tenesmus. Symptoms are consistent with acute exacerbation of ulcerative colitis  #2 chronic active hepatitis C-no treatment to date #3 fatigue  Plan; we'll check CBC with differential, sedimentation rate, CRP, CMET Start prednisone 20 mg by mouth every morning 2 weeks and then taper by 5 mg per week until off Start Lialda 1.2 g, 2 by mouth twice a day Patient will be referred to the Hepatology clinic/[facility] for treatment of hep C. He will follow-up with Dr. Ardis Hughs or myself in 4-6 weeks. He is advised to call in the interim if symptoms worsen or if he does not see any improvement in his symptoms on the above regimen. We also discussed follow-up colonoscopy. I think he should have another colonoscopy as last exam was done October 2013 with poor prep. We will plan to set up for colonoscopy at the time of follow-up office visit.  Aquan Kope S Peyton Rossner PA-C 04/12/2016   Cc: Dorothyann Peng, NP

## 2016-04-15 ENCOUNTER — Telehealth: Payer: Self-pay | Admitting: *Deleted

## 2016-04-15 NOTE — Telephone Encounter (Signed)
Received fax from [facility] clinic today 04-14-2016.  They will contact the patient with appointment information and notify your office with the date.

## 2016-04-19 ENCOUNTER — Telehealth: Payer: Self-pay | Admitting: *Deleted

## 2016-04-19 NOTE — Telephone Encounter (Signed)
Received a fax from [facility]. They have made an appointment for the patient for 05-13-2016 at 9:00 am.  They have notified the patient.

## 2016-05-28 ENCOUNTER — Ambulatory Visit: Payer: BLUE CROSS/BLUE SHIELD | Admitting: Gastroenterology

## 2016-06-01 ENCOUNTER — Ambulatory Visit (INDEPENDENT_AMBULATORY_CARE_PROVIDER_SITE_OTHER): Payer: BLUE CROSS/BLUE SHIELD | Admitting: Gastroenterology

## 2016-06-01 ENCOUNTER — Encounter: Payer: Self-pay | Admitting: Gastroenterology

## 2016-06-01 VITALS — BP 108/80 | HR 80 | Ht 65.75 in | Wt 167.1 lb

## 2016-06-01 DIAGNOSIS — K51919 Ulcerative colitis, unspecified with unspecified complications: Secondary | ICD-10-CM | POA: Diagnosis not present

## 2016-06-01 DIAGNOSIS — K219 Gastro-esophageal reflux disease without esophagitis: Secondary | ICD-10-CM

## 2016-06-01 MED ORDER — NA SULFATE-K SULFATE-MG SULF 17.5-3.13-1.6 GM/177ML PO SOLN
1.0000 | Freq: Once | ORAL | 0 refills | Status: AC
Start: 2016-06-01 — End: 2016-06-01

## 2016-06-01 NOTE — Progress Notes (Signed)
Review of pertinent gastrointestinal problems: 1. Ulcerative colitis:  He was told he had colitis around 2000 by Dr. Watt Climes;  recent bleeding, diarrhea lead to colonoscopy October 2013 ([facility]iz[facility] Chunn).  Incomplete colonoscopy due to inflammation, poor prep. He did have moderate left-sided colitis that ended around the splenic flexure. Biopsies of normal-appearing transverse colon were normal, biopsies of inflamed appearing left colon showed chronic inflammation. He started prednisone 40 mg once daily. Improved rapidly, changed to mesalamine but he never took this and we never heard back from him for 3-4 years. 2. Hepatitis C without cirrhosis; referred to [facility] liver clinic; 2016 note shows he was offered further testing in order to proceed with treatment but I do not think he ever followed up with them.  Referred back 04/2016 by [facility]    HPI: This is a  very pleasant 61 year old man whom I saw last 3-4 years ago. He was here in our office 2 months ago when he saw Amy Ester would  Chief complaint is GERD, ulcerative colitis  He was given samples of lialda, this made him nauseas.  He stopped because of the nausea.  Only took the prednisone alone.  Started at 40mg  daily, slowly tapered down.  Feeling much better within a week.  No bleeding anymore, diarrhea gone.  He bowels are fine now, solid, nonbloody.  Having burning, discomfort.  After eating especially.  Even water.  Burns until a tums.  Has been for a long time, for a while.  Hansel Starling makes it worse  No dysphagia.  soiunds like h2 blockers.    lost weight with prednisone, then gained.  He believes certain foods cause his bowels to flare.  He is not interested in maintenance medicines for his colitis   ROS: complete GI ROS as described in HPI.  Constitutional:  No unintentional weight loss   Past Medical History:  Diagnosis Date  . Allergy    SEASONAL  . GERD (gastroesophageal reflux disease)   . Hepatitis C   . Ulcerative colitis ([place])  2002    Past Surgical History:  Procedure Laterality Date  . COLONOSCOPY    . KNEE ARTHROSCOPY Left   . SHOULDER SURGERY Left     Current Outpatient Prescriptions  Medication Sig Dispense Refill  . predniSONE (DELTASONE) 10 MG tablet Take 20 mg ( 2 tab) x 14 days, take 15 mg ( 1 1/2 tabs) x 14 days, take 10 mg ( 1 tab)  x 14 days, take 5 mg (1/2 tab) x 14 days. 60 tablet 0   No current facility-administered medications for this visit.     Allergies as of 06/01/2016 - Review Complete 06/01/2016  Allergen Reaction Noted  . Codeine Nausea Only 02/07/2012    Family History  Problem Relation Age of Onset  . Hypertension Mother   . Stroke Mother   . Alzheimer's disease Mother   . Colon cancer Neg Hx     Social History   Social History  . Marital status: Married    Spouse name: [name]  . Number of children: 3  . Years of education: N/A   Occupational History  . insurance sales    Social History Main Topics  . Smoking status: Former Smoker    Types: Cigarettes  . Smokeless tobacco: Never Used  . Alcohol use 0.0 oz/week     Comment: social  . Drug use: No  . Sexual activity: Not on file   Other Topics Concern  . Not on file   Social History  Narrative   [place]   Married with three boys, youngest is 61 years of age. All live locally.       Likes to play golf, fish, ride motorcycle.      Physical Exam: BP 108/80 (BP Location: Left Arm, Patient Position: Sitting, Cuff Size: Normal)   Pulse 80   Ht 5' 5.75" (1.67 m) Comment: height measured without shoes  Wt 167 lb 2 oz (75.8 kg)   BMI 27.18 kg/m  Constitutional: generally well-appearing Psychiatric: alert and oriented x3 Abdomen: soft, nontender, nondistended, no obvious ascites, no peritoneal signs, normal bowel sounds No peripheral edema noted in lower extremities  Assessment and plan: 61 y.o. male with Chronic left sided ulcerative colitis, GERD.  In the past 4-5 years he has  had 2 flares of bloody diarrhea, urgency. These were both treated with relatively short prednisone dosing. After both time we have recommended he start mesalamine including this time. He is not interested in maintenance medicines because he had some GI upset with one of the mesalamine formulations. His mesalamine tapers have been very effective both times. He is currently on 5 mg once daily and is nearly done with all the prednisone. He feels absolute fine now. I did explain to him that I would prefer he be on a maintenance medicine which is the more usual way to prevent flares. He is not sure if he would like to do that. We'll get a proceed with colonoscopy to get a idea of how healthy his colon looks on the inside, it is been 17 years since he has had a good full colonoscopy, the last one in 2013 was poorly prepped. We also discussed his GERD which is a new excacerbation. He is not on proton pump inhibitors on a regular basis but he start that with over-the-counter omeprazole. He'll take this once daily. Either call to report on his response to this in for 5 weeks. He has no alarm symptoms that would warrant endoscopic evaluation.  Please see the "Patient Instructions" section for addition details about the plan.  Owens Loffler, MD [facility] 06/01/2016, 8:33 AM

## 2016-06-01 NOTE — Patient Instructions (Addendum)
Omeprazole 20mg  pill, take one pill every morning 20-30 min before breakfast meal daily. Call 4-5 weeks to report on your response. You will be set up for a colonoscopy chronic ulcerative colitis. Will decide about maintenance meds after colonoscopy, clinical course. Stop prednisone

## 2016-07-12 ENCOUNTER — Encounter: Payer: BLUE CROSS/BLUE SHIELD | Admitting: Gastroenterology

## 2016-07-27 ENCOUNTER — Encounter: Payer: BLUE CROSS/BLUE SHIELD | Admitting: Gastroenterology

## 2016-12-20 DIAGNOSIS — F321 Major depressive disorder, single episode, moderate: Secondary | ICD-10-CM | POA: Diagnosis not present

## 2016-12-21 ENCOUNTER — Encounter: Payer: Self-pay | Admitting: Adult Health

## 2016-12-21 ENCOUNTER — Ambulatory Visit (INDEPENDENT_AMBULATORY_CARE_PROVIDER_SITE_OTHER): Payer: BLUE CROSS/BLUE SHIELD | Admitting: Adult Health

## 2016-12-21 VITALS — BP 134/84 | Temp 99.0°F | Wt 174.0 lb

## 2016-12-21 DIAGNOSIS — F329 Major depressive disorder, single episode, unspecified: Secondary | ICD-10-CM | POA: Diagnosis not present

## 2016-12-21 DIAGNOSIS — F32A Depression, unspecified: Secondary | ICD-10-CM

## 2016-12-21 MED ORDER — BUPROPION HCL ER (SR) 150 MG PO TB12
150.0000 mg | ORAL_TABLET | Freq: Two times a day (BID) | ORAL | 3 refills | Status: DC
Start: 2016-12-21 — End: 2017-01-28

## 2016-12-21 NOTE — Progress Notes (Signed)
Subjective:    Patient ID: Anthony Buck, male    DOB: April 25, 1956, 61 y.o.   MRN: [id number]  HPI  61 year old male who  has a past medical history of Allergy; GERD (gastroesophageal reflux disease); Hepatitis C; and Ulcerative colitis ([place]) (2002). He presents to the office today after being evaluated by his psychologist, Dr.Ragan. He was diagnosed with depression and was advised to follow up with his PCP for medication. Anthony Buck reports that he has been depressed for " a long time". He reports that he finds it difficult to concentrate on tasks and he is no longer interested in doing things that he once did. He denies any SI but states " if something were to happen to me and I got in a bike accident, I wouldn't care"   He is tearful during this exam and is ready to start medication.   He has a follow up appointment with Dr. Enis Gash in two days.    Review of Systems See HPI   Past Medical History:  Diagnosis Date  . Allergy    SEASONAL  . GERD (gastroesophageal reflux disease)   . Hepatitis C   . Ulcerative colitis ([place]) 2002    Social History   Social History  . Marital status: Married    Spouse name: Anthony Buck  . Number of children: 3  . Years of education: N/A   Occupational History  . insurance sales    Social History Main Topics  . Smoking status: Former Smoker    Types: Cigarettes  . Smokeless tobacco: Never Used  . Alcohol use 0.0 oz/week     Comment: social  . Drug use: No  . Sexual activity: Not on file   Other Topics Concern  . Not on file   Social History Narrative   [place]   Married with three boys, youngest is 61 years of age. All live locally.       Likes to play golf, fish, ride motorcycle.     Past Surgical History:  Procedure Laterality Date  . COLONOSCOPY    . KNEE ARTHROSCOPY Left   . SHOULDER SURGERY Left     Family History  Problem Relation Age of Onset  . Hypertension Mother   . Stroke Mother   . Alzheimer's  disease Mother   . Colon cancer Neg Hx     Allergies  Allergen Reactions  . Codeine Nausea Only    No current outpatient prescriptions on file prior to visit.   No current facility-administered medications on file prior to visit.     BP 134/84 (BP Location: Left Arm)   Temp 99 F (37.2 C) (Oral)   Wt 174 lb (78.9 kg)   BMI 28.30 kg/m       Objective:   Physical Exam  Constitutional: He is oriented to person, place, and time. He appears well-developed and well-nourished. No distress.  Cardiovascular: Normal rate, regular rhythm, normal heart sounds and intact distal pulses.  Exam reveals no gallop and no friction rub.   No murmur heard. Pulmonary/Chest: Effort normal and breath sounds normal. No respiratory distress. He has no wheezes. He has no rales. He exhibits no tenderness.  Neurological: He is alert and oriented to person, place, and time.  Skin: Skin is warm and dry. No rash noted. He is not diaphoretic. No erythema. No pallor.  Psychiatric: He has a normal mood and affect. His behavior is normal. Judgment and thought content normal.  Nursing note  and vitals reviewed.     Assessment & Plan:  1. Depression, unspecified depression type - We talked at length about different medications and the side effects of anti depressants.  - buPROPion (WELLBUTRIN SR) 150 MG 12 hr tablet; Take 1 tablet (150 mg total) by mouth 2 (two) times daily.  Dispense: 60 tablet; Refill: 3 - Advised that if he develops active suicidal thoughts that he needs to stop medication and follow up at the ER - Follow up with me in one month for CPE  Dorothyann Peng, NP

## 2016-12-23 DIAGNOSIS — F321 Major depressive disorder, single episode, moderate: Secondary | ICD-10-CM | POA: Diagnosis not present

## 2016-12-29 DIAGNOSIS — F321 Major depressive disorder, single episode, moderate: Secondary | ICD-10-CM | POA: Diagnosis not present

## 2017-01-03 DIAGNOSIS — F321 Major depressive disorder, single episode, moderate: Secondary | ICD-10-CM | POA: Diagnosis not present

## 2017-01-24 ENCOUNTER — Encounter: Payer: BLUE CROSS/BLUE SHIELD | Admitting: Adult Health

## 2017-01-28 ENCOUNTER — Ambulatory Visit (INDEPENDENT_AMBULATORY_CARE_PROVIDER_SITE_OTHER): Payer: BLUE CROSS/BLUE SHIELD | Admitting: Family Medicine

## 2017-01-28 ENCOUNTER — Encounter: Payer: Self-pay | Admitting: Adult Health

## 2017-01-28 ENCOUNTER — Encounter: Payer: Self-pay | Admitting: Family Medicine

## 2017-01-28 VITALS — BP 122/84 | HR 82 | Temp 98.6°F | Wt 169.0 lb

## 2017-01-28 DIAGNOSIS — F419 Anxiety disorder, unspecified: Secondary | ICD-10-CM | POA: Diagnosis not present

## 2017-01-28 DIAGNOSIS — F339 Major depressive disorder, recurrent, unspecified: Secondary | ICD-10-CM

## 2017-01-28 LAB — BASIC METABOLIC PANEL
BUN: 21 mg/dL (ref 6–23)
CALCIUM: 10.2 mg/dL (ref 8.4–10.5)
CO2: 28 mEq/L (ref 19–32)
CREATININE: 1.34 mg/dL (ref 0.40–1.50)
Chloride: 104 mEq/L (ref 96–112)
GFR: 57.53 mL/min — ABNORMAL LOW (ref >60.00)
Glucose, Bld: 89 mg/dL (ref 70–99)
Potassium: 4.2 mEq/L (ref 3.5–5.1)
Sodium: 140 mEq/L (ref 135–145)

## 2017-01-28 LAB — CBC
HCT: 47.4 % (ref 39.0–52.0)
Hemoglobin: 16.1 g/dL (ref 13.0–17.0)
MCHC: 34 g/dL (ref 30.0–36.0)
MCV: 91.5 fl (ref 78.0–100.0)
Platelets: 239 10*3/uL (ref 150.0–400.0)
RBC: 5.18 Mil/uL (ref 4.22–5.81)
RDW: 13.1 % (ref 11.5–15.5)
WBC: 8.3 10*3/uL (ref 4.0–10.5)

## 2017-01-28 LAB — TSH: TSH: 2.65 u[IU]/mL (ref 0.35–4.50)

## 2017-01-28 MED ORDER — ESCITALOPRAM OXALATE 10 MG PO TABS: 10.0000 mg | ORAL_TABLET | Freq: Every day | ORAL | 1 refills | Status: DC

## 2017-01-28 NOTE — Progress Notes (Signed)
HPI:  Acute visit for depression: -Ongoing for many years, but worsening last several months -Saw PCP about a month ago and started Wellbutrin, has also been getting some counseling -Thought he was doing better at first, then picked up a new bottle Wellbutrin ( new distributor) and since then has felt worse, worse depression, worse anxiety, can't sleep, sad, difficult decision-making, cognitive fog feels shaky and nervous, feels unsettled, muscle tension -On review of chronic symptoms - it sounds like he has underlying generalized anxiety disorder with depression, without psychotic features, suicidal ideation or manic symptoms  ROS: See pertinent positives and negatives per HPI.  Past Medical History:  Diagnosis Date  . Allergy    SEASONAL  . GERD (gastroesophageal reflux disease)   . Hepatitis C   . Ulcerative colitis ([place]) 2002    Past Surgical History:  Procedure Laterality Date  . COLONOSCOPY    . KNEE ARTHROSCOPY Left   . SHOULDER SURGERY Left     Family History  Problem Relation Age of Onset  . Hypertension Mother   . Stroke Mother   . Alzheimer's disease Mother   . Colon cancer Neg Hx     Social History   Social History  . Marital status: Married    Spouse name: [name]  . Number of children: 3  . Years of education: N/A   Occupational History  . insurance sales    Social History Main Topics  . Smoking status: Former Smoker    Types: Cigarettes  . Smokeless tobacco: Never Used  . Alcohol use 0.0 oz/week     Comment: social  . Drug use: No  . Sexual activity: Not Asked   Other Topics Concern  . None   Social History Narrative   [employer]   Married with three boys, youngest is 61 years of age. All live locally.       Likes to play golf, fish, ride motorcycle.      Current Outpatient Prescriptions:  .  loratadine (CLARITIN) 10 MG tablet, Take 1 tablet by mouth daily as needed., Disp: , Rfl:  .  escitalopram (LEXAPRO) 10 MG  tablet, Take 1 tablet (10 mg total) by mouth daily., Disp: 30 tablet, Rfl: 1  EXAM:  Vitals:   01/28/17 1429  BP: 122/84  Pulse: 82  Temp: 98.6 F (37 C)  SpO2: 97%    Body mass index is 27.49 kg/m.  GENERAL: vitals reviewed and listed above, alert, oriented, appears well hydrated and in no acute distress  HEENT: atraumatic, conjunttiva clear, no obvious abnormalities on inspection of external nose and ears  NECK: no obvious masses on inspection  LUNGS: clear to auscultation bilaterally, no wheezes, rales or rhonchi, good air movement  CV: HRRR, no peripheral edema  MS: moves all extremities without noticeable abnormality  PSYCH: pleasant and cooperative, no obvious depression or anxiety  ASSESSMENT AND PLAN:  Discussed the following assessment and plan:  Depression, recurrent (HCC)  Anxiety - Plan: Basic metabolic panel, CBC, TSH  -discussed potential etiologies for his symptoms and most likely anxiety and depression are the cause. Wellbutrin can sometimes worsen anxiety. Regardless, he has decided to change to a different medication. We discussed different options will try Lexapro. -We will get some basic labs today to exclude other etiologies for his symptoms -Advised close follow-up with his counselor and his PCP -Discussed emergency precautions for any severe symptoms or thoughts of harm to himself or others - patient and wife feels safe, they report  that they are never hopeless as their hope is in the [place] -Patient advised to return or notify a doctor immediately if symptoms worsen or persist or new concerns arise.  Patient Instructions  BEFORE YOU LEAVE: -labs -follow up: as scheduled with PCP  STOP the Wellbutrin.  START the Lexapro 10 mg once daily.  Call your counselor today and get plugged back in with counseling.  We have ordered labs or studies at this visit. It can take up to 1-2 weeks for results and processing. IF results require follow up or  explanation, we will call you with instructions. Clinically stable results will be released to your [facility]. If you have not heard from [place] or cannot find your results in [facility] in 2 weeks please contact our office at [phone].  If you are not yet signed up for [facility], please consider signing up.  I hope you are feeling better soon! Seek care immediately if worsening or new concerns or you are not improving with treatment.             Colin Benton R., DO

## 2017-01-28 NOTE — Patient Instructions (Addendum)
BEFORE YOU LEAVE: -labs -follow up: as scheduled with PCP  STOP the Wellbutrin.  START the Lexapro 10 mg once daily.  Call your counselor today and get plugged back in with counseling.  We have ordered labs or studies at this visit. It can take up to 1-2 weeks for results and processing. IF results require follow up or explanation, we will call you with instructions. Clinically stable results will be released to your [facility]. If you have not heard from [place] or cannot find your results in [facility] in 2 weeks please contact our office at [phone].  If you are not yet signed up for [facility], please consider signing up.  I hope you are feeling better soon! Seek care immediately if worsening or new concerns or you are not improving with treatment.

## 2017-02-14 ENCOUNTER — Encounter: Payer: Self-pay | Admitting: Adult Health

## 2017-02-14 ENCOUNTER — Ambulatory Visit (INDEPENDENT_AMBULATORY_CARE_PROVIDER_SITE_OTHER): Payer: BLUE CROSS/BLUE SHIELD | Admitting: Adult Health

## 2017-02-14 VITALS — BP 116/80 | Temp 98.3°F | Ht 66.0 in | Wt 175.0 lb

## 2017-02-14 DIAGNOSIS — G479 Sleep disorder, unspecified: Secondary | ICD-10-CM | POA: Diagnosis not present

## 2017-02-14 DIAGNOSIS — F329 Major depressive disorder, single episode, unspecified: Secondary | ICD-10-CM | POA: Diagnosis not present

## 2017-02-14 DIAGNOSIS — Z125 Encounter for screening for malignant neoplasm of prostate: Secondary | ICD-10-CM | POA: Diagnosis not present

## 2017-02-14 DIAGNOSIS — F419 Anxiety disorder, unspecified: Secondary | ICD-10-CM | POA: Diagnosis not present

## 2017-02-14 DIAGNOSIS — Z0000 Encounter for general adult medical examination without abnormal findings: Secondary | ICD-10-CM | POA: Diagnosis not present

## 2017-02-14 DIAGNOSIS — K739 Chronic hepatitis, unspecified: Secondary | ICD-10-CM | POA: Diagnosis not present

## 2017-02-14 DIAGNOSIS — F32A Depression, unspecified: Secondary | ICD-10-CM

## 2017-02-14 LAB — HEPATIC FUNCTION PANEL
ALT: 32 U/L (ref 0–53)
AST: 22 U/L (ref 0–37)
Albumin: 4.3 g/dL (ref 3.5–5.2)
Alkaline Phosphatase: 49 U/L (ref 39–117)
Bilirubin, Direct: 0.1 mg/dL (ref 0.0–0.3)
TOTAL PROTEIN: 7.3 g/dL (ref 6.0–8.3)
Total Bilirubin: 0.5 mg/dL (ref 0.2–1.2)

## 2017-02-14 LAB — LIPID PANEL
CHOL/HDL RATIO: 4
CHOLESTEROL: 199 mg/dL (ref 0–200)
HDL: 53 mg/dL (ref >39.00)
LDL Cholesterol: 120 mg/dL — ABNORMAL HIGH (ref 0–99)
NonHDL: 145.79
TRIGLYCERIDES: 130 mg/dL (ref 0.0–149.0)
VLDL: 26 mg/dL (ref 0.0–40.0)

## 2017-02-14 LAB — BASIC METABOLIC PANEL
BUN: 18 mg/dL (ref 6–23)
CHLORIDE: 103 meq/L (ref 96–112)
CO2: 26 meq/L (ref 19–32)
CREATININE: 1.07 mg/dL (ref 0.40–1.50)
Calcium: 10 mg/dL (ref 8.4–10.5)
GFR: 74.57 mL/min (ref >60.00)
GLUCOSE: 107 mg/dL — AB (ref 70–99)
Potassium: 4.4 mEq/L (ref 3.5–5.1)
Sodium: 139 mEq/L (ref 135–145)

## 2017-02-14 LAB — HEMOGLOBIN A1C: HEMOGLOBIN A1C: 5.7 % (ref 4.6–6.5)

## 2017-02-14 LAB — PSA: PSA: 6.31 ng/mL — ABNORMAL HIGH (ref 0.10–4.00)

## 2017-02-14 MED ORDER — CYCLOBENZAPRINE HCL 10 MG PO TABS: 10.0000 mg | ORAL_TABLET | Freq: Every day | ORAL | 0 refills | Status: DC

## 2017-02-14 NOTE — Progress Notes (Signed)
Subjective:    Patient ID: Anthony Buck, male    DOB: 02-24-1956, 61 y.o.   MRN: [id number]  HPI  Patient presents for yearly preventative medicine examination. He is a pleasant 61 year old male who  has a past medical history of Allergy; GERD (gastroesophageal reflux disease); Hepatitis C; and Ulcerative colitis ([place]) (2002).  His depression is controlled without medications   He was referred to to [facility] HEP C clinic by GI but never went. He states today " I know this is something I need to do, and I am ready to go. I have two grand babies that I need to be around for"   All immunizations and health maintenance protocols were reviewed with the patient and needed orders were placed. Refused flu shot.   Appropriate screening laboratory values were ordered for the patient including screening of hyperlipidemia, renal function and hepatic function. If indicated by BPH, a PSA was ordered.  Medication reconciliation,  past medical history, social history, problem list and allergies were reviewed in detail with the patient  Goals were established with regard to weight loss, exercise, and  diet in compliance with medications. He is walking nightly with his wife. He has started to work on a heart healthy diet.   He is up to date on colonoscopy. He does  see a dentist,  but does not see eye doctor on a regular basis.   His biggest concern today is new onset of insomnia. He reports that he over the last mont or so he has been unable to fall asleep much before midnight or 1 am. He is able to stay asleep once he falls asleep.   Review of Systems  Constitutional: Negative.   HENT: Negative.   Eyes: Negative.   Respiratory: Negative.   Cardiovascular: Negative.   Gastrointestinal: Negative.   Endocrine: Negative.   Genitourinary: Negative.   Musculoskeletal: Positive for arthralgias.  Skin: Negative.   Allergic/Immunologic: Negative.   Neurological: Negative.   Hematological: Negative.     Psychiatric/Behavioral: Positive for sleep disturbance.  All other systems reviewed and are negative.  Past Medical History:  Diagnosis Date  . Allergy    SEASONAL  . GERD (gastroesophageal reflux disease)   . Hepatitis C   . Ulcerative colitis ([place]) 2002    Social History   Social History  . Marital status: Married    Spouse name: [name]  . Number of children: 3  . Years of education: N/A   Occupational History  . insurance sales    Social History Main Topics  . Smoking status: Former Smoker    Types: Cigarettes  . Smokeless tobacco: Never Used  . Alcohol use 0.0 oz/week     Comment: social  . Drug use: No  . Sexual activity: Not on file   Other Topics Concern  . Not on file   Social History Narrative   [place]   Married with three boys, youngest is 60 years of age. All live locally.       Likes to play golf, fish, ride motorcycle.     Past Surgical History:  Procedure Laterality Date  . COLONOSCOPY    . KNEE ARTHROSCOPY Left   . SHOULDER SURGERY Left     Family History  Problem Relation Age of Onset  . Hypertension Mother   . Stroke Mother   . Alzheimer's disease Mother   . Colon cancer Neg Hx     Allergies  Allergen Reactions  .  Codeine Nausea Only    Current Outpatient Prescriptions on File Prior to Visit  Medication Sig Dispense Refill  . loratadine (CLARITIN) 10 MG tablet Take 1 tablet by mouth daily as needed.     No current facility-administered medications on file prior to visit.     BP 116/80 (BP Location: Left Arm)   Temp 98.3 F (36.8 C) (Oral)   Ht 5\' 6"  (1.676 m)   Wt 175 lb (79.4 kg)   BMI 28.25 kg/m       Objective:   Physical Exam  Constitutional: He is oriented to person, place, and time. He appears well-developed and well-nourished. No distress.  HENT:  Head: Normocephalic and atraumatic.  Right Ear: External ear normal.  Left Ear: External ear normal.  Nose: Nose normal.  Mouth/Throat:  Oropharynx is clear and moist. No oropharyngeal exudate.  Eyes: Pupils are equal, round, and reactive to light. Conjunctivae and EOM are normal. Right eye exhibits no discharge. Left eye exhibits no discharge.  Neck: Normal range of motion. Neck supple. No JVD present. No tracheal deviation present. No thyromegaly present.  Cardiovascular: Normal rate, regular rhythm, normal heart sounds and intact distal pulses.  Exam reveals no gallop and no friction rub.   No murmur heard. Pulmonary/Chest: Effort normal and breath sounds normal. No stridor. No respiratory distress. He has no wheezes. He has no rales. He exhibits no tenderness.  Abdominal: Soft. Bowel sounds are normal. He exhibits no distension and no mass. There is no tenderness. There is no rebound and no guarding.  Genitourinary: Rectum normal. Prostate is enlarged. Prostate is not tender.  Musculoskeletal: Normal range of motion. He exhibits no edema, tenderness or deformity.  Lymphadenopathy:    He has no cervical adenopathy.  Neurological: He is alert and oriented to person, place, and time. He has normal reflexes. He displays normal reflexes. No cranial nerve deficit. He exhibits normal muscle tone. Coordination normal.  Skin: Skin is warm and dry. No rash noted. He is not diaphoretic. No erythema. No pallor.  Surgical scan on left shoulder   Psychiatric: He has a normal mood and affect. His behavior is normal. Judgment and thought content normal.  Nursing note and vitals reviewed.     Assessment & Plan:   1. Routine general medical examination at a health care facility - Educated on the importance of diet and exercise  - He needs to follow up with Hep C clinic  - Follow up in one year or sooner if needed - Basic metabolic panel - Hepatic function panel - Hemoglobin A1c - Lipid panel - PSA  2. Anxiety and depression - No medications   3. Chronic hepatitis ([place])  - AMB referral to hepatitis C clinic  4. Sleep  disturbance - Advised to try Melatonin 5 mg    Dorothyann Peng, NP

## 2017-02-14 NOTE — Patient Instructions (Signed)
It was great seeing you this morning  I have put a referral in for the Hep C clinic - please go to this   I will follow up with you regarding your blood work   Continue to walk at night and work on diet   Try Melatonin to help you sleep ( you do not need any more than 5 mg)

## 2017-02-17 ENCOUNTER — Encounter: Payer: Self-pay | Admitting: Adult Health

## 2017-03-07 ENCOUNTER — Encounter: Payer: Self-pay | Admitting: Adult Health

## 2017-03-08 ENCOUNTER — Other Ambulatory Visit: Payer: Self-pay | Admitting: Adult Health

## 2017-03-08 DIAGNOSIS — R972 Elevated prostate specific antigen [PSA]: Secondary | ICD-10-CM

## 2017-03-21 ENCOUNTER — Ambulatory Visit (INDEPENDENT_AMBULATORY_CARE_PROVIDER_SITE_OTHER): Payer: BLUE CROSS/BLUE SHIELD | Admitting: Family Medicine

## 2017-03-21 ENCOUNTER — Encounter: Payer: Self-pay | Admitting: Family Medicine

## 2017-03-21 VITALS — BP 130/90 | HR 83 | Temp 98.8°F

## 2017-03-21 DIAGNOSIS — J209 Acute bronchitis, unspecified: Secondary | ICD-10-CM

## 2017-03-21 DIAGNOSIS — S29011A Strain of muscle and tendon of front wall of thorax, initial encounter: Secondary | ICD-10-CM

## 2017-03-21 MED ORDER — AZITHROMYCIN 250 MG PO TABS: ORAL_TABLET | ORAL | 0 refills | Status: DC

## 2017-03-21 NOTE — Progress Notes (Signed)
   Subjective:    Patient ID: Anthony Buck, male    DOB: 1955-12-10, 61 y.o.   MRN: [id number]  HPI Here for one week of chest congestion and coughing up green sputum. No fever. Several days ago after coughing hard he felt a sharp pain in the left mid chest area, and this now hurts every time he coughs. Using ice packs and Mucinex.    Review of Systems  Constitutional: Negative.   HENT: Negative.   Eyes: Negative.   Respiratory: Positive for cough and chest tightness. Negative for wheezing.   Cardiovascular: Positive for chest pain. Negative for palpitations and leg swelling.       Objective:   Physical Exam  Constitutional: He appears well-developed and well-nourished.  HENT:  Right Ear: External ear normal.  Left Ear: External ear normal.  Nose: Nose normal.  Mouth/Throat: Oropharynx is clear and moist.  Eyes: Conjunctivae are normal.  Neck: No thyromegaly present.  Cardiovascular: Normal rate, regular rhythm, normal heart sounds and intact distal pulses.  Pulmonary/Chest: Effort normal. He has no rales.  Scattered wheezes and rhonchi. He is very tender over the left sternal margin and around the left middle rib area   Lymphadenopathy:    He has no cervical adenopathy.          Assessment & Plan:  Bronchitis, treat with a Zpack. He also has also strained an intercostal muscle. Use Ibuprofen prn.  Alysia Penna, MD

## 2017-03-21 NOTE — Patient Instructions (Signed)
WE NOW OFFER   [facility] FAST TRACK!!!  SAME DAY Appointments for ACUTE CARE  Such as: Sprains, Injuries, cuts, abrasions, rashes, muscle pain, joint pain, back pain Colds, flu, sore throats, headache, allergies, cough, fever  Ear pain, sinus and eye infections Abdominal pain, nausea, vomiting, diarrhea, upset stomach Animal/insect bites  3 Easy Ways to Schedule: Walk-In Scheduling Call in scheduling Mychart Sign-up: [email or web address]         

## 2017-03-28 ENCOUNTER — Other Ambulatory Visit: Payer: Self-pay | Admitting: Family Medicine

## 2017-03-28 ENCOUNTER — Telehealth: Payer: Self-pay | Admitting: Adult Health

## 2017-03-28 ENCOUNTER — Ambulatory Visit: Payer: Self-pay

## 2017-03-28 NOTE — Telephone Encounter (Signed)
Patient just finished the Z-pak 1 weeks ago. He seemed to improved on the antibiotic- but off of it he seems to be worse. He would like to see if he can get another treatment. Patient talks at work and it has been difficult to work. [phone]  Reason for Disposition . Cough has been present for > 3 weeks  Answer Assessment - Initial Assessment Questions 1. ONSET: "When did the cough begin?"      3 weeks- patient was treated with antibiotic and was getting better 2. SEVERITY: "How bad is the cough today?"      Cough gets worse when he talks- he does feel it loosening up 3. RESPIRATORY DISTRESS: "Describe your breathing."      Can get out of breath- at times.  4. FEVER: "Do you have a fever?" If so, ask: "What is your temperature, how was it measured, and when did it start?"     no 5. SPUTUM: "Describe the color of your sputum" (clear, white, yellow, green)     yellow 6. HEMOPTYSIS: "Are you coughing up any blood?" If so ask: "How much?" (flecks, streaks, tablespoons, etc.)     no 7. CARDIAC HISTORY: "Do you have any history of heart disease?" (e.g., heart attack, congestive heart failure)      no 8. LUNG HISTORY: "Do you have any history of lung disease?"  (e.g., pulmonary embolus, asthma, emphysema)     no 9. PE RISK FACTORS: "Do you have a history of blood clots?" (or: recent major surgery, recent prolonged travel, bedridden )     no 10. OTHER SYMPTOMS: "Do you have any other symptoms?" (e.g., runny nose, wheezing, chest pain)       Chest pain- from coughing 11. PREGNANCY: "Is there any chance you are pregnant?" "When was your last menstrual period?"       n/a 12. TRAVEL: "Have you traveled out of the country in the last month?" (e.g., travel history, exposures)       n/a  Protocols used: [place]

## 2017-03-28 NOTE — Telephone Encounter (Signed)
I am sorry but he will need to be seen for another round of antibiotics

## 2017-03-28 NOTE — Telephone Encounter (Signed)
called pt left message

## 2017-03-28 NOTE — Telephone Encounter (Signed)
Copied from [place] [id number]. Topic: Quick Communication - See Telephone Encounter >> Mar 28, 2017 10:32 AM Arletha Grippe wrote: CRM for notification. See Telephone encounter for:   03/28/17.pt is still having a really bad cough and congestion. He is taking Mucinex abut cough keeps hanging on. He is requesting another z pack. Please send to [facility] on [address] Pt cb number  [phone]

## 2017-03-28 NOTE — Telephone Encounter (Signed)
Sent to PCP for approval to refill antibiotic again.

## 2017-03-28 NOTE — Telephone Encounter (Signed)
Duplicated see pervious notes.

## 2017-03-28 NOTE — Telephone Encounter (Signed)
Called pt and left a VM to call back. CRM created. Pt needs an OV with PCP for another antibiotic.

## 2017-03-28 NOTE — Telephone Encounter (Signed)
Unfortunately Dr. Sarajane Jews had to leave the office early today. Pt's request was sent to pt's PCP [facility] who stated that the pt would need an OV for another round of antibiotics. Sent to Dr. Sarajane Jews to consider when back in the office on Tuesday. Pt was last seen by doctor Sarajane Jews on 03/21/2017 for acute bronchitis .

## 2017-04-06 DIAGNOSIS — B182 Chronic viral hepatitis C: Secondary | ICD-10-CM | POA: Diagnosis not present

## 2017-04-07 ENCOUNTER — Other Ambulatory Visit: Payer: Self-pay | Admitting: Nurse Practitioner

## 2017-04-07 DIAGNOSIS — B182 Chronic viral hepatitis C: Secondary | ICD-10-CM

## 2017-05-11 ENCOUNTER — Other Ambulatory Visit: Payer: BLUE CROSS/BLUE SHIELD

## 2017-06-28 DIAGNOSIS — B182 Chronic viral hepatitis C: Secondary | ICD-10-CM | POA: Diagnosis not present

## 2017-07-07 DIAGNOSIS — B182 Chronic viral hepatitis C: Secondary | ICD-10-CM | POA: Diagnosis not present

## 2017-07-14 DIAGNOSIS — B182 Chronic viral hepatitis C: Secondary | ICD-10-CM | POA: Diagnosis not present

## 2017-07-18 ENCOUNTER — Ambulatory Visit
Admission: RE | Admit: 2017-07-18 | Discharge: 2017-07-18 | Disposition: A | Discharge status: Home / Self Care | Payer: BLUE CROSS/BLUE SHIELD | Source: Ambulatory Visit | Attending: Nurse Practitioner | Admitting: Nurse Practitioner

## 2017-07-18 ENCOUNTER — Other Ambulatory Visit: Payer: Self-pay | Admitting: Nurse Practitioner

## 2017-07-18 DIAGNOSIS — B182 Chronic viral hepatitis C: Secondary | ICD-10-CM

## 2017-07-21 ENCOUNTER — Ambulatory Visit
Admission: RE | Admit: 2017-07-21 | Discharge: 2017-07-21 | Disposition: A | Discharge status: Home / Self Care | Payer: BLUE CROSS/BLUE SHIELD | Source: Ambulatory Visit | Attending: Nurse Practitioner | Admitting: Nurse Practitioner

## 2017-07-21 DIAGNOSIS — B182 Chronic viral hepatitis C: Secondary | ICD-10-CM | POA: Diagnosis not present

## 2017-07-22 DIAGNOSIS — K740 Hepatic fibrosis: Secondary | ICD-10-CM | POA: Diagnosis not present

## 2017-07-22 DIAGNOSIS — B182 Chronic viral hepatitis C: Secondary | ICD-10-CM | POA: Diagnosis not present

## 2017-07-22 DIAGNOSIS — K219 Gastro-esophageal reflux disease without esophagitis: Secondary | ICD-10-CM | POA: Diagnosis not present

## 2017-09-07 DIAGNOSIS — B182 Chronic viral hepatitis C: Secondary | ICD-10-CM | POA: Diagnosis not present

## 2017-11-02 DIAGNOSIS — B182 Chronic viral hepatitis C: Secondary | ICD-10-CM | POA: Diagnosis not present

## 2017-11-07 DIAGNOSIS — K7469 Other cirrhosis of liver: Secondary | ICD-10-CM | POA: Diagnosis not present

## 2017-11-07 DIAGNOSIS — B182 Chronic viral hepatitis C: Secondary | ICD-10-CM | POA: Diagnosis not present

## 2017-12-16 ENCOUNTER — Ambulatory Visit: Payer: BLUE CROSS/BLUE SHIELD | Admitting: Gastroenterology

## 2018-01-24 DIAGNOSIS — B182 Chronic viral hepatitis C: Secondary | ICD-10-CM | POA: Diagnosis not present

## 2018-01-30 DIAGNOSIS — B182 Chronic viral hepatitis C: Secondary | ICD-10-CM | POA: Diagnosis not present

## 2018-01-30 DIAGNOSIS — K7469 Other cirrhosis of liver: Secondary | ICD-10-CM | POA: Diagnosis not present

## 2018-02-02 ENCOUNTER — Other Ambulatory Visit: Payer: Self-pay | Admitting: Nurse Practitioner

## 2018-02-02 DIAGNOSIS — K7469 Other cirrhosis of liver: Secondary | ICD-10-CM

## 2018-02-20 ENCOUNTER — Other Ambulatory Visit: Payer: BLUE CROSS/BLUE SHIELD

## 2018-02-28 ENCOUNTER — Ambulatory Visit
Admission: RE | Admit: 2018-02-28 | Discharge: 2018-02-28 | Disposition: A | Discharge status: Home / Self Care | Payer: BLUE CROSS/BLUE SHIELD | Source: Ambulatory Visit | Attending: Nurse Practitioner | Admitting: Nurse Practitioner

## 2018-02-28 DIAGNOSIS — K824 Cholesterolosis of gallbladder: Secondary | ICD-10-CM | POA: Diagnosis not present

## 2018-02-28 DIAGNOSIS — K7469 Other cirrhosis of liver: Secondary | ICD-10-CM

## 2018-05-23 ENCOUNTER — Encounter: Payer: Self-pay | Admitting: Adult Health

## 2018-05-23 ENCOUNTER — Ambulatory Visit (INDEPENDENT_AMBULATORY_CARE_PROVIDER_SITE_OTHER): Payer: BLUE CROSS/BLUE SHIELD | Admitting: Adult Health

## 2018-05-23 VITALS — BP 118/80 | Temp 98.4°F | Ht 66.0 in | Wt 176.0 lb

## 2018-05-23 DIAGNOSIS — Z5320 Procedure and treatment not carried out because of patient's decision for unspecified reasons: Secondary | ICD-10-CM | POA: Diagnosis not present

## 2018-05-23 DIAGNOSIS — Z0000 Encounter for general adult medical examination without abnormal findings: Secondary | ICD-10-CM | POA: Diagnosis not present

## 2018-05-23 DIAGNOSIS — G5601 Carpal tunnel syndrome, right upper limb: Secondary | ICD-10-CM

## 2018-05-23 DIAGNOSIS — Z125 Encounter for screening for malignant neoplasm of prostate: Secondary | ICD-10-CM | POA: Diagnosis not present

## 2018-05-23 LAB — CBC WITH DIFFERENTIAL/PLATELET
BASOS PCT: 0.7 % (ref 0.0–3.0)
Basophils Absolute: 0 10*3/uL (ref 0.0–0.1)
EOS ABS: 0.1 10*3/uL (ref 0.0–0.7)
Eosinophils Relative: 1.9 % (ref 0.0–5.0)
HCT: 47.8 % (ref 39.0–52.0)
Hemoglobin: 16.2 g/dL (ref 13.0–17.0)
LYMPHS PCT: 27.9 % (ref 12.0–46.0)
Lymphs Abs: 1.8 10*3/uL (ref 0.7–4.0)
MCHC: 34 g/dL (ref 30.0–36.0)
MCV: 90.3 fl (ref 78.0–100.0)
Monocytes Absolute: 0.4 10*3/uL (ref 0.1–1.0)
Monocytes Relative: 5.6 % (ref 3.0–12.0)
NEUTROS ABS: 4 10*3/uL (ref 1.4–7.7)
Neutrophils Relative %: 63.9 % (ref 43.0–77.0)
PLATELETS: 223 10*3/uL (ref 150.0–400.0)
RBC: 5.29 Mil/uL (ref 4.22–5.81)
RDW: 13.1 % (ref 11.5–15.5)
WBC: 6.3 10*3/uL (ref 4.0–10.5)

## 2018-05-23 LAB — COMPREHENSIVE METABOLIC PANEL
ALT: 19 U/L (ref 0–53)
AST: 19 U/L (ref 0–37)
Albumin: 4.7 g/dL (ref 3.5–5.2)
Alkaline Phosphatase: 54 U/L (ref 39–117)
BUN: 21 mg/dL (ref 6–23)
CALCIUM: 10.5 mg/dL (ref 8.4–10.5)
CHLORIDE: 101 meq/L (ref 96–112)
CO2: 29 meq/L (ref 19–32)
Creatinine, Ser: 1.18 mg/dL (ref 0.40–1.50)
GFR: 66.33 mL/min (ref >60.00)
Glucose, Bld: 94 mg/dL (ref 70–99)
Potassium: 4.7 mEq/L (ref 3.5–5.1)
Sodium: 139 mEq/L (ref 135–145)
Total Bilirubin: 0.7 mg/dL (ref 0.2–1.2)
Total Protein: 7.8 g/dL (ref 6.0–8.3)

## 2018-05-23 LAB — LIPID PANEL
CHOL/HDL RATIO: 4
Cholesterol: 228 mg/dL — ABNORMAL HIGH (ref 0–200)
HDL: 52.4 mg/dL (ref >39.00)
LDL CALC: 149 mg/dL — AB (ref 0–99)
NonHDL: 175.1
Triglycerides: 131 mg/dL (ref 0.0–149.0)
VLDL: 26.2 mg/dL (ref 0.0–40.0)

## 2018-05-23 LAB — PSA: PSA: 5.06 ng/mL — ABNORMAL HIGH (ref 0.10–4.00)

## 2018-05-23 LAB — TSH: TSH: 3.67 u[IU]/mL (ref 0.35–4.50)

## 2018-05-23 MED ORDER — PREDNISONE 10 MG PO TABS: ORAL_TABLET | ORAL | 0 refills | Status: DC

## 2018-05-23 NOTE — Progress Notes (Signed)
Subjective:    Patient ID: Anthony Buck, male    DOB: [birth year], 63 y.o.   MRN: [id number]  HPI Patient presents for yearly preventative medicine examination. He is a pleasant 63 year old male who  has a past medical history of Allergy, GERD (gastroesophageal reflux disease), Hepatitis C, and Ulcerative colitis ([place]) (2002).  Hep C -he was seen by [facility] and underwent 12 weeks of treatment with Epclusa in 2019, on completion of treatment he had no detectable HCV RNA. He continues to be seen every 6 months   Elevated PSA -PSA was 6.31 this was up from 3.31, 2 years prior.  I advised him to come back in 3 months for repeat labs but he never showed up. Lab Results  Component Value Date   PSA 6.31 (H) 02/14/2017   PSA 3.31 12/19/2014   Right hand numbness - this continues to happen and he feels as though it is happening more frequently and has been more painful. Reports that he feels as though his second and third fingers are going to sleep and he gets pain at the distal ends of his fingers. This happens more when he rides his motorcycle or at night when he is sleeping. He has not been dropping items but feels as though his grip strength has decreased.   All immunizations and health maintenance protocols were reviewed with the patient and needed orders were placed.  Appropriate screening laboratory values were ordered for the patient including screening of hyperlipidemia, renal function and hepatic function. If indicated by BPH, a PSA was ordered.  Medication reconciliation,  past medical history, social history, problem list and allergies were reviewed in detail with the patient  Goals were established with regard to weight loss, exercise, and  diet in compliance with medications Wt Readings from Last 3 Encounters:  05/23/18 176 lb (79.8 kg)  02/14/17 175 lb (79.4 kg)  01/28/17 169 lb (76.7 kg)    Review of Systems  Constitutional: Negative.   HENT: Negative.   Eyes:  Negative.   Respiratory: Negative.   Cardiovascular: Negative.   Gastrointestinal: Negative.   Endocrine: Negative.   Genitourinary: Negative.   Musculoskeletal: Negative.   Skin: Negative.   Allergic/Immunologic: Negative.   Neurological: Positive for numbness.  Hematological: Negative.   Psychiatric/Behavioral: Negative.   All other systems reviewed and are negative.  Past Medical History:  Diagnosis Date  . Allergy    SEASONAL  . GERD (gastroesophageal reflux disease)   . Hepatitis C   . Ulcerative colitis ([place]) 2002    Social History   Socioeconomic History  . Marital status: Married    Spouse name: [name]  . Number of children: 3  . Years of education: Not on file  . Highest education level: Not on file  Occupational History  . Occupation: Herbalist  Social Needs  . Financial resource strain: Not on file  . Food insecurity:    Worry: Not on file    Inability: Not on file  . Transportation needs:    Medical: Not on file    Non-medical: Not on file  Tobacco Use  . Smoking status: Former Smoker    Types: Cigarettes  . Smokeless tobacco: Never Used  Substance and Sexual Activity  . Alcohol use: Yes    Alcohol/week: 0.0 standard drinks    Comment: social  . Drug use: No  . Sexual activity: Not on file  Lifestyle  . Physical activity:    Days  per week: Not on file    Minutes per session: Not on file  . Stress: Not on file  Relationships  . Social connections:    Talks on phone: Not on file    Gets together: Not on file    Attends religious service: Not on file    Active member of club or organization: Not on file    Attends meetings of clubs or organizations: Not on file    Relationship status: Not on file  . Intimate partner violence:    Fear of current or ex partner: Not on file    Emotionally abused: Not on file    Physically abused: Not on file    Forced sexual activity: Not on file  Other Topics Concern  . Not on file  Social History  Narrative   [place]   Married with three boys, youngest is 63 years of age. All live locally.       Likes to play golf, fish, ride motorcycle.     Past Surgical History:  Procedure Laterality Date  . COLONOSCOPY    . KNEE ARTHROSCOPY Left   . SHOULDER SURGERY Left     Family History  Problem Relation Age of Onset  . Hypertension Mother   . Stroke Mother   . Alzheimer's disease Mother   . Colon cancer Neg Hx     Allergies  Allergen Reactions  . Codeine Nausea Only    Current Outpatient Medications on File Prior to Visit  Medication Sig Dispense Refill  . loratadine (CLARITIN) 10 MG tablet Take 1 tablet by mouth daily as needed.     No current facility-administered medications on file prior to visit.     BP 118/80   Temp 98.4 F (36.9 C)   Ht 5\' 6"  (1.676 m)   Wt 176 lb (79.8 kg)   BMI 28.41 kg/m       Objective:   Physical Exam Vitals signs and nursing note reviewed.  Constitutional:      General: He is not in acute distress.    Appearance: Normal appearance. He is well-developed and normal weight. He is not diaphoretic.  HENT:     Head: Normocephalic and atraumatic.     Right Ear: Tympanic membrane, ear canal and external ear normal. There is no impacted cerumen.     Left Ear: Tympanic membrane, ear canal and external ear normal. There is no impacted cerumen.     Nose: Nose normal. No congestion or rhinorrhea.     Mouth/Throat:     Mouth: Mucous membranes are moist.     Pharynx: Oropharynx is clear. No oropharyngeal exudate or posterior oropharyngeal erythema.  Eyes:     General: No scleral icterus.       Right eye: No discharge.        Left eye: No discharge.     Conjunctiva/sclera: Conjunctivae normal.     Pupils: Pupils are equal, round, and reactive to light.  Neck:     Musculoskeletal: Normal range of motion and neck supple.     Thyroid: No thyromegaly.     Vascular: No carotid bruit.     Trachea: No tracheal deviation.    Cardiovascular:     Rate and Rhythm: Normal rate and regular rhythm.     Pulses: Normal pulses.     Heart sounds: Normal heart sounds. No murmur. No friction rub. No gallop.   Pulmonary:     Effort: Pulmonary effort is normal. No respiratory  distress.     Breath sounds: Normal breath sounds. No stridor. No wheezing, rhonchi or rales.  Chest:     Chest wall: No tenderness.  Abdominal:     General: Bowel sounds are normal. There is no distension.     Palpations: Abdomen is soft. There is no mass.     Tenderness: There is no abdominal tenderness. There is no right CVA tenderness, left CVA tenderness, guarding or rebound.     Hernia: No hernia is present.  Musculoskeletal: Normal range of motion.        General: No swelling, tenderness, deformity or signs of injury.     Right lower leg: No edema.     Left lower leg: No edema.  Lymphadenopathy:     Cervical: No cervical adenopathy.  Skin:    General: Skin is warm and dry.     Capillary Refill: Capillary refill takes less than 2 seconds.     Coloration: Skin is not jaundiced or pale.     Findings: No bruising, erythema, lesion or rash.  Neurological:     General: No focal deficit present.     Mental Status: He is alert and oriented to person, place, and time.     Cranial Nerves: No cranial nerve deficit.     Sensory: No sensory deficit.     Motor: No weakness.     Coordination: Coordination normal.     Gait: Gait normal.     Deep Tendon Reflexes: Reflexes normal.     Comments: No decreased grip strength. Has full rom with right hand and right shoulder   + phalens test  ( -) tinels sign   Psychiatric:        Mood and Affect: Mood normal.        Behavior: Behavior normal.        Thought Content: Thought content normal.        Judgment: Judgment normal.        Assessment & Plan:  1. Routine general medical examination at a health care facility - Continue to diet and exercise  - Follow up in one year or sooner if needed -  CBC with Differential/Platelet - Comprehensive metabolic panel - Lipid panel - TSH  2. Prostate cancer screening  - PSA - Consider referral to urology  3. HIV screening   - HIV Antibody (routine testing w rflx)  4. Carpal tunnel syndrome of right wrist  - predniSONE (DELTASONE) 10 MG tablet; Take 2 tablets for 7 days and then 1 tablet for 7 days  Dispense: 21 tablet; Refill: 0 - Follow up after prednisone therapy if no improvement  - Consider referral to sports medicine   Dorothyann Peng

## 2018-05-24 LAB — HIV ANTIBODY (ROUTINE TESTING W REFLEX): HIV 1&2 Ab, 4th Generation: NONREACTIVE

## 2018-05-26 ENCOUNTER — Other Ambulatory Visit: Payer: Self-pay | Admitting: Family Medicine

## 2018-05-26 ENCOUNTER — Encounter: Payer: Self-pay | Admitting: Family Medicine

## 2018-05-26 DIAGNOSIS — R972 Elevated prostate specific antigen [PSA]: Secondary | ICD-10-CM

## 2018-07-18 DIAGNOSIS — K7469 Other cirrhosis of liver: Secondary | ICD-10-CM | POA: Diagnosis not present

## 2018-07-24 ENCOUNTER — Other Ambulatory Visit: Payer: Self-pay | Admitting: Nurse Practitioner

## 2018-07-24 DIAGNOSIS — K7469 Other cirrhosis of liver: Secondary | ICD-10-CM

## 2018-07-26 ENCOUNTER — Ambulatory Visit
Admission: RE | Admit: 2018-07-26 | Discharge: 2018-07-26 | Disposition: A | Discharge status: Home / Self Care | Payer: BLUE CROSS/BLUE SHIELD | Source: Ambulatory Visit | Attending: Nurse Practitioner | Admitting: Nurse Practitioner

## 2018-07-26 DIAGNOSIS — K7469 Other cirrhosis of liver: Secondary | ICD-10-CM

## 2018-07-26 DIAGNOSIS — K7460 Unspecified cirrhosis of liver: Secondary | ICD-10-CM | POA: Diagnosis not present

## 2018-10-24 ENCOUNTER — Ambulatory Visit (INDEPENDENT_AMBULATORY_CARE_PROVIDER_SITE_OTHER): Payer: BC Managed Care – PPO

## 2018-10-24 ENCOUNTER — Ambulatory Visit (INDEPENDENT_AMBULATORY_CARE_PROVIDER_SITE_OTHER): Payer: BC Managed Care – PPO | Admitting: Adult Health

## 2018-10-24 ENCOUNTER — Other Ambulatory Visit: Payer: Self-pay

## 2018-10-24 ENCOUNTER — Encounter: Payer: Self-pay | Admitting: Adult Health

## 2018-10-24 VITALS — BP 140/102 | Temp 98.6°F | Wt 175.0 lb

## 2018-10-24 DIAGNOSIS — R5383 Other fatigue: Secondary | ICD-10-CM

## 2018-10-24 DIAGNOSIS — R079 Chest pain, unspecified: Secondary | ICD-10-CM | POA: Diagnosis not present

## 2018-10-24 DIAGNOSIS — R0602 Shortness of breath: Secondary | ICD-10-CM

## 2018-10-24 DIAGNOSIS — G4700 Insomnia, unspecified: Secondary | ICD-10-CM

## 2018-10-24 MED ORDER — TRAZODONE HCL 50 MG PO TABS: 25.0000 mg | ORAL_TABLET | Freq: Every evening | ORAL | 3 refills | Status: DC | PRN

## 2018-10-24 NOTE — Progress Notes (Addendum)
Subjective:    Patient ID: Anthony Buck, male    DOB: 07/29/1955, 63 y.o.   MRN: [id number]  HPI  63 year old male who  has a past medical history of Allergy, GERD (gastroesophageal reflux disease), Hepatitis C, and Ulcerative colitis ([place]) (2002).  Presents to the office today for valuation of fatigue, shortness of breath at rest and with exertion,, difficulty concentrating, insomnia, and lack of appetite.  Symptoms have all been present for roughly 3 or 4 months.  Reports that he constantly feels fatigued during waking hours, for example he went on a motorcycle ride the other day when he got home he did not have the energy to do anything else.  He also reports that when he is working outside he will feel worn out after a short period of time, short of breath.  When he wakes up in the morning he feels as though his wheezing but this sensation eventually goes away.  Generally, he reports that he is not sleeping well and will wake up almost every morning around 3:57 AM and not be able to go back to sleep.  His wife reports no snoring or waking up is been for breath.  Tried melatonin but this did not work for him.  At times at work where he feels as though he is having difficulty concentrating and he cannot complete tasks.   He denies weight gain, lower extremity edema, nausea, vomiting, fevers, chills, diarrhea, constipation.  And asked about chest pain he does report intermittent "sharp stabbing, sensation under the left breast that lasts maybe a second at a time.  He denies depression    BP Readings from Last 3 Encounters:  10/24/18 (!) 140/102  05/23/18 118/80  03/21/17 130/90   Wt Readings from Last 3 Encounters:  10/24/18 175 lb (79.4 kg)  05/23/18 176 lb (79.8 kg)  02/14/17 175 lb (79.4 kg)     Review of Systems See HPI   Past Medical History:  Diagnosis Date  . Allergy    SEASONAL  . GERD (gastroesophageal reflux disease)   . Hepatitis C   . Ulcerative colitis  ([place]) 2002    Social History   Socioeconomic History  . Marital status: Married    Spouse name: [name]  . Number of children: 3  . Years of education: Not on file  . Highest education level: Not on file  Occupational History  . Occupation: Herbalist  Social Needs  . Financial resource strain: Not on file  . Food insecurity    Worry: Not on file    Inability: Not on file  . Transportation needs    Medical: Not on file    Non-medical: Not on file  Tobacco Use  . Smoking status: Former Smoker    Types: Cigarettes  . Smokeless tobacco: Never Used  Substance and Sexual Activity  . Alcohol use: Yes    Alcohol/week: 0.0 standard drinks    Comment: social  . Drug use: No  . Sexual activity: Not on file  Lifestyle  . Physical activity    Days per week: Not on file    Minutes per session: Not on file  . Stress: Not on file  Relationships  . Social Herbalist on phone: Not on file    Gets together: Not on file    Attends religious service: Not on file    Active member of club or organization: Not on file    Attends meetings of  clubs or organizations: Not on file    Relationship status: Not on file  . Intimate partner violence    Fear of current or ex partner: Not on file    Emotionally abused: Not on file    Physically abused: Not on file    Forced sexual activity: Not on file  Other Topics Concern  . Not on file  Social History Narrative   [place]   Married with three boys, youngest is 63 years of age. All live locally.       Likes to play golf, fish, ride motorcycle.     Past Surgical History:  Procedure Laterality Date  . COLONOSCOPY    . KNEE ARTHROSCOPY Left   . SHOULDER SURGERY Left     Family History  Problem Relation Age of Onset  . Hypertension Mother   . Stroke Mother   . Alzheimer's disease Mother   . Colon cancer Neg Hx     Allergies  Allergen Reactions  . Codeine Nausea Only    Current Outpatient  Medications on File Prior to Visit  Medication Sig Dispense Refill  . loratadine (CLARITIN) 10 MG tablet Take 1 tablet by mouth daily as needed.    Marland Kitchen omeprazole (PRILOSEC) 20 MG capsule Take 20 mg by mouth daily.     No current facility-administered medications on file prior to visit.     BP (!) 140/102   Temp 98.6 F (37 C) (Oral)   Wt 175 lb (79.4 kg)   BMI 28.25 kg/m       Objective:   Physical Exam Vitals signs and nursing note reviewed.  Constitutional:      Appearance: Normal appearance.  HENT:     Mouth/Throat:     Mouth: Mucous membranes are moist.     Pharynx: Oropharynx is clear.  Cardiovascular:     Rate and Rhythm: Normal rate and regular rhythm.     Pulses: Normal pulses.     Heart sounds: Normal heart sounds.  Pulmonary:     Effort: Pulmonary effort is normal.     Breath sounds: Normal breath sounds.  Abdominal:     General: Abdomen is flat. Bowel sounds are normal. There is no distension.     Palpations: Abdomen is soft. There is no mass.     Tenderness: There is no abdominal tenderness.  Musculoskeletal: Normal range of motion.        General: No swelling or tenderness.     Right lower leg: No edema.     Left lower leg: No edema.  Skin:    General: Skin is warm and dry.     Capillary Refill: Capillary refill takes less than 2 seconds.  Neurological:     General: No focal deficit present.     Mental Status: He is alert and oriented to person, place, and time.  Psychiatric:        Mood and Affect: Mood normal.        Behavior: Behavior normal.        Thought Content: Thought content normal.        Judgment: Judgment normal.       Assessment & Plan:  1. Fatigue, unspecified type -Unknown cause at this time.  We will look at labs and chest x-ray.  Consider referral to cardiology for possible stress echo. -His blood pressure is up slightly in the office today.  We will have him monitor this at home.  Consider low-dose lisinopril. - CBC  with  Differential/Platelet - DG Chest 2 View; Future - TSH - EKG 12-Lead- NSR, Rate 76  - CMP - Vitamin D, 25-hydroxy - Brain Natriuretic Peptide - Iron and TIBC - DG Chest 2 View  2. Insomnia, unspecified type - Will trial trazodone.  - CBC with Differential/Platelet - TSH - CMP - Vitamin D, 25-hydroxy - traZODone (DESYREL) 50 MG tablet; Take 0.5-1 tablets (25-50 mg total) by mouth at bedtime as needed for sleep.  Dispense: 30 tablet; Refill: 3  3. Shortness of breath  - CBC with Differential/Platelet - TSH - EKG 12-Lead - CMP - Vitamin D, 25-hydroxy - Brain Natriuretic Peptide - Iron and TIBC - DG Chest 2 View   Dorothyann Peng, NP

## 2018-10-25 ENCOUNTER — Telehealth: Payer: Self-pay | Admitting: Adult Health

## 2018-10-25 DIAGNOSIS — R5383 Other fatigue: Secondary | ICD-10-CM

## 2018-10-25 DIAGNOSIS — R0602 Shortness of breath: Secondary | ICD-10-CM

## 2018-10-25 LAB — CBC WITH DIFFERENTIAL/PLATELET
Basophils Absolute: 0.1 10*3/uL (ref 0.0–0.1)
Basophils Relative: 0.6 % (ref 0.0–3.0)
Eosinophils Absolute: 0.3 10*3/uL (ref 0.0–0.7)
Eosinophils Relative: 3.5 % (ref 0.0–5.0)
HCT: 48.2 % (ref 39.0–52.0)
Hemoglobin: 16.4 g/dL (ref 13.0–17.0)
Lymphocytes Relative: 31.4 % (ref 12.0–46.0)
Lymphs Abs: 2.9 10*3/uL (ref 0.7–4.0)
MCHC: 33.9 g/dL (ref 30.0–36.0)
MCV: 91.1 fl (ref 78.0–100.0)
Monocytes Absolute: 0.5 10*3/uL (ref 0.1–1.0)
Monocytes Relative: 5.5 % (ref 3.0–12.0)
Neutro Abs: 5.4 10*3/uL (ref 1.4–7.7)
Neutrophils Relative %: 59 % (ref 43.0–77.0)
Platelets: 232 10*3/uL (ref 150.0–400.0)
RBC: 5.3 Mil/uL (ref 4.22–5.81)
RDW: 13 % (ref 11.5–15.5)
WBC: 9.1 10*3/uL (ref 4.0–10.5)

## 2018-10-25 LAB — COMPREHENSIVE METABOLIC PANEL
ALT: 19 U/L (ref 0–53)
AST: 19 U/L (ref 0–37)
Albumin: 4.6 g/dL (ref 3.5–5.2)
Alkaline Phosphatase: 55 U/L (ref 39–117)
BUN: 18 mg/dL (ref 6–23)
CO2: 25 mEq/L (ref 19–32)
Calcium: 10 mg/dL (ref 8.4–10.5)
Chloride: 104 mEq/L (ref 96–112)
Creatinine, Ser: 0.97 mg/dL (ref 0.40–1.50)
GFR: 78.14 mL/min (ref >60.00)
Glucose, Bld: 87 mg/dL (ref 70–99)
Potassium: 4.4 mEq/L (ref 3.5–5.1)
Sodium: 141 mEq/L (ref 135–145)
Total Bilirubin: 0.7 mg/dL (ref 0.2–1.2)
Total Protein: 7.4 g/dL (ref 6.0–8.3)

## 2018-10-25 LAB — BRAIN NATRIURETIC PEPTIDE: Pro B Natriuretic peptide (BNP): 19 pg/mL (ref 0.0–100.0)

## 2018-10-25 LAB — TSH: TSH: 2.5 u[IU]/mL (ref 0.35–4.50)

## 2018-10-25 LAB — IRON, TOTAL/TOTAL IRON BINDING CAP
%SAT: 36 % (calc) (ref 20–48)
Iron: 113 ug/dL (ref 50–180)
TIBC: 312 mcg/dL (calc) (ref 250–425)

## 2018-10-25 LAB — VITAMIN D 25 HYDROXY (VIT D DEFICIENCY, FRACTURES): VITD: 44.58 ng/mL (ref 30.00–100.00)

## 2018-10-25 NOTE — Telephone Encounter (Signed)
Spoketo patient and informed him of his lab work and chest x-ray, all came back within normal limits.  I will have him pick up a blood pressure cuff and monitor his blood pressure at home he can send me his readings via my chart in a week  Due to symptoms and duration will send to cardiology for further evaluation and possible stress test.

## 2018-10-26 ENCOUNTER — Encounter: Payer: Self-pay | Admitting: Adult Health

## 2018-10-27 ENCOUNTER — Encounter: Payer: Self-pay | Admitting: Adult Health

## 2018-10-29 ENCOUNTER — Encounter: Payer: Self-pay | Admitting: Adult Health

## 2018-11-08 ENCOUNTER — Encounter: Payer: Self-pay | Admitting: Adult Health

## 2018-11-11 ENCOUNTER — Encounter: Payer: Self-pay | Admitting: Adult Health

## 2018-11-18 ENCOUNTER — Encounter: Payer: Self-pay | Admitting: Adult Health

## 2018-11-20 ENCOUNTER — Ambulatory Visit: Payer: BC Managed Care – PPO | Admitting: Cardiology

## 2018-11-20 ENCOUNTER — Ambulatory Visit (INDEPENDENT_AMBULATORY_CARE_PROVIDER_SITE_OTHER): Payer: BC Managed Care – PPO | Admitting: Cardiology

## 2018-11-20 ENCOUNTER — Other Ambulatory Visit: Payer: Self-pay

## 2018-11-20 ENCOUNTER — Encounter: Payer: Self-pay | Admitting: Cardiology

## 2018-11-20 ENCOUNTER — Encounter: Payer: Self-pay | Admitting: *Deleted

## 2018-11-20 ENCOUNTER — Other Ambulatory Visit: Payer: Self-pay | Admitting: Cardiology

## 2018-11-20 VITALS — BP 122/66 | HR 85 | Ht 66.0 in | Wt 172.0 lb

## 2018-11-20 DIAGNOSIS — K51011 Ulcerative (chronic) pancolitis with rectal bleeding: Secondary | ICD-10-CM | POA: Diagnosis not present

## 2018-11-20 DIAGNOSIS — B1920 Unspecified viral hepatitis C without hepatic coma: Secondary | ICD-10-CM | POA: Diagnosis not present

## 2018-11-20 DIAGNOSIS — R0600 Dyspnea, unspecified: Secondary | ICD-10-CM

## 2018-11-20 DIAGNOSIS — R0609 Other forms of dyspnea: Secondary | ICD-10-CM | POA: Diagnosis not present

## 2018-11-20 DIAGNOSIS — E782 Mixed hyperlipidemia: Secondary | ICD-10-CM | POA: Diagnosis not present

## 2018-11-20 DIAGNOSIS — R079 Chest pain, unspecified: Secondary | ICD-10-CM

## 2018-11-20 NOTE — Patient Instructions (Signed)
Medication Instructions:  Your physician recommends that you continue on your current medications as directed. Please refer to the Current Medication list given to you today.  If you need a refill on your cardiac medications before your next appointment, please call your pharmacy.   Lab work: Your physician recommends that you return for lab work in: TODAY  CBC, D [place]  If you have labs (blood work) drawn today and your tests are completely normal, you will receive your results only by: Marland Kitchen MyChart Message (if you have MyChart) OR . A paper copy in the mail If you have any lab test that is abnormal or we need to change your treatment, we will call you to review the results.  Testing/Procedures: Your physician has requested that you have a lexiscan myoview. For further information please visit [email or web address]. Please follow instruction sheet, as given.  Your physician has requested that you have an echocardiogram. Echocardiography is a painless test that uses sound waves to create images of your heart. It provides your doctor with information about the size and shape of your heart and how well your heart's chambers and valves are working. This procedure takes approximately one hour. There are no restrictions for this procedure.  You will be scheduled for a Ct calcium score. Our appointment is  Aug 13th ,2020 at 62 at the [place], [place] , [place] office   Follow-Up: At [facility], you and your health needs are our priority.  As part of our continuing mission to provide you with exceptional heart care, we have created designated Provider Care Teams.  These Care Teams include your primary Cardiologist (physician) and Advanced Practice Providers (APPs -  Physician Assistants and Nurse Practitioners) who all work together to provide you with the care you need, when you need it. You will need a follow up appointment in 1 months.   Any Other Special Instructions Will Be Listed Below  (If Applicable).   Cardiac Nuclear Scan A cardiac nuclear scan is a test that is done to check the flow of blood to your heart. It is done when you are resting and when you are exercising. The test looks for problems such as:  Not enough blood reaching a portion of the heart.  The heart muscle not working as it should. You may need this test if:  You have heart disease.  You have had lab results that are not normal.  You have had heart surgery or a balloon procedure to open up blocked arteries (angioplasty).  You have chest pain.  You have shortness of breath. In this test, a special dye (tracer) is put into your bloodstream. The tracer will travel to your heart. A camera will then take pictures of your heart to see how the tracer moves through your heart. This test is usually done at a hospital and takes 2-4 hours. Tell a doctor about:  Any allergies you have.  All medicines you are taking, including vitamins, herbs, eye drops, creams, and over-the-counter medicines.  Any problems you or family members have had with anesthetic medicines.  Any blood disorders you have.  Any surgeries you have had.  Any medical conditions you have.  Whether you are pregnant or may be pregnant. What are the risks? Generally, this is a safe test. However, problems may occur, such as:  Serious chest pain and heart attack. This is only a risk if the stress portion of the test is done.  Rapid heartbeat.  A feeling of warmth in  your chest. This feeling usually does not last long.  Allergic reaction to the tracer. What happens before the test?  Ask your doctor about changing or stopping your normal medicines. This is important.  Follow instructions from your doctor about what you cannot eat or drink.  Remove your jewelry on the day of the test. What happens during the test?  An IV tube will be inserted into one of your veins.  Your doctor will give you a small amount of tracer through  the IV tube.  You will wait for 20-40 minutes while the tracer moves through your bloodstream.  Your heart will be monitored with an electrocardiogram (ECG).  You will lie down on an exam table.  Pictures of your heart will be taken for about 15-20 minutes.  You may also have a stress test. For this test, one of these things may be done: ? You will be asked to exercise on a treadmill or a stationary bike. ? You will be given medicines that will make your heart work harder. This is done if you are unable to exercise.  When blood flow to your heart has peaked, a tracer will again be given through the IV tube.  After 20-40 minutes, you will get back on the exam table. More pictures will be taken of your heart.  Depending on the tracer that is used, more pictures may need to be taken 3-4 hours later.  Your IV tube will be removed when the test is over. The test may vary among doctors and hospitals. What happens after the test?  Ask your doctor: ? Whether you can return to your normal schedule, including diet, activities, and medicines. ? Whether you should drink more fluids. This will help to remove the tracer from your body. Drink enough fluid to keep your pee (urine) pale yellow.  Ask your doctor, or the department that is doing the test: ? When will my results be ready? ? How will I get my results? Summary  A cardiac nuclear scan is a test that is done to check the flow of blood to your heart.  Tell your doctor whether you are pregnant or may be pregnant.  Before the test, ask your doctor about changing or stopping your normal medicines. This is important.  Ask your doctor whether you can return to your normal activities. You may be asked to drink more fluids. This information is not intended to replace advice given to you by your health care provider. Make sure you discuss any questions you have with your health care provider. Document Released: 10/10/2017 Document Revised:  08/16/2018 Document Reviewed: 10/10/2017 Elsevier Patient Education  [place].  Echocardiogram An echocardiogram is a procedure that uses painless sound waves (ultrasound) to produce an image of the heart. Images from an echocardiogram can provide important information about:  Signs of coronary artery disease (CAD).  Aneurysm detection. An aneurysm is a weak or damaged part of an artery wall that bulges out from the normal force of blood pumping through the body.  Heart size and shape. Changes in the size or shape of the heart can be associated with certain conditions, including heart failure, aneurysm, and CAD.  Heart muscle function.  Heart valve function.  Signs of a past heart attack.  Fluid buildup around the heart.  Thickening of the heart muscle.  A tumor or infectious growth around the heart valves. Tell a health care provider about:  Any allergies you have.  All medicines you  are taking, including vitamins, herbs, eye drops, creams, and over-the-counter medicines.  Any blood disorders you have.  Any surgeries you have had.  Any medical conditions you have.  Whether you are pregnant or may be pregnant. What are the risks? Generally, this is a safe procedure. However, problems may occur, including:  Allergic reaction to dye (contrast) that may be used during the procedure. What happens before the procedure? No specific preparation is needed. You may eat and drink normally. What happens during the procedure?   An IV tube may be inserted into one of your veins.  You may receive contrast through this tube. A contrast is an injection that improves the quality of the pictures from your heart.  A gel will be applied to your chest.  A wand-like tool (transducer) will be moved over your chest. The gel will help to transmit the sound waves from the transducer.  The sound waves will harmlessly bounce off of your heart to allow the heart images to be  captured in real-time motion. The images will be recorded on a computer. The procedure may vary among health care providers and hospitals. What happens after the procedure?  You may return to your normal, everyday life, including diet, activities, and medicines, unless your health care provider tells you not to do that. Summary  An echocardiogram is a procedure that uses painless sound waves (ultrasound) to produce an image of the heart.  Images from an echocardiogram can provide important information about the size and shape of your heart, heart muscle function, heart valve function, and fluid buildup around your heart.  You do not need to do anything to prepare before this procedure. You may eat and drink normally.  After the echocardiogram is completed, you may return to your normal, everyday life, unless your health care provider tells you not to do that. This information is not intended to replace advice given to you by your health care provider. Make sure you discuss any questions you have with your health care provider. Document Released: 04/23/2000 Document Revised: 08/17/2018 Document Reviewed: 05/29/2016 Elsevier Patient Education  2020 [employer].

## 2018-11-20 NOTE — Addendum Note (Signed)
Addended by: Particia Nearing B on: 11/20/2018 03:49 PM   Modules accepted: Orders

## 2018-11-20 NOTE — Progress Notes (Signed)
Cardiology Office Note:    Date:  11/20/2018   ID:  Anthony Buck, DOB [birth year], MRN [id number]  PCP:  Dorothyann Peng, NP  Cardiologist:  Jenean Lindau, MD   Referring MD: Dorothyann Peng, NP    ASSESSMENT:    1. Dyspnea on exertion   2. Mixed dyslipidemia   3. Hepatitis C virus infection without hepatic coma, unspecified chronicity   4. Ulcerative pancolitis with rectal bleeding (HCC)    PLAN:    In order of problems listed above:  1. Dyspnea on exertion: I discussed my findings with the patient at extensive length.  His symptoms are very concerning.  His wife was on the phone all the time and she was also confirming his symptoms.  I would like to get a d-dimer today.  In view of this and the fact that he has ulcerative colitis and some blood in the stool we will also get a CBC.  He recently had CBC.  He is talking to his primary care physician about the symptoms. 2. To assess his dyspnea on exertion I will do a Lexiscan sestamibi to rule out any coronary etiology for the symptoms.  Echocardiogram will be done to assess murmur heard on auscultation. 3. Patient needs re-stratification and I discussed calcium scoring and he is agreeable. 4. He will be seen in follow-up appointment in a month or earlier if he has any concerns.  He knows to go to the nearest emergency room for any concerning symptoms. 5. Hepatitis C: As mentioned above he mentions to me that he took the treatment and is cured. 6. Mixed dyslipidemia: Diet was discussed.  Risks of elevated lipids discussed and we will review this in follow-up appointment in a month.   Medication Adjustments/Labs and Tests Ordered: Current medicines are reviewed at length with the patient today.  Concerns regarding medicines are outlined above.  No orders of the defined types were placed in this encounter.  No orders of the defined types were placed in this encounter.    History of Present Illness:    Anthony Buck is a 63  y.o. male who is being seen today for the evaluation of dyspnea on exertion at the request of Dorothyann Peng, NP.  Patient is a pleasant 63 year old male.  He has past medical history of ulcerative colitis.  He tells me that he has history of hepatitis C but this is cured.  He mentions to me that he is having significant shortness of breath on exertion and this is new for him.  No orthopnea or PND.  No chest tightness.  For this reason he is here for evaluation.  At the time of my evaluation, the patient is alert awake oriented and in no distress.  Past Medical History:  Diagnosis Date  . Allergy    SEASONAL  . GERD (gastroesophageal reflux disease)   . Hepatitis C   . Ulcerative colitis ([place]) 2002    Past Surgical History:  Procedure Laterality Date  . COLONOSCOPY    . KNEE ARTHROSCOPY Left   . SHOULDER SURGERY Left     Current Medications: Current Meds  Medication Sig  . loratadine (CLARITIN) 10 MG tablet Take 1 tablet by mouth daily as needed.  Marland Kitchen omeprazole (PRILOSEC) 20 MG capsule Take 20 mg by mouth daily.     Allergies:   Codeine   Social History   Socioeconomic History  . Marital status: Married    Spouse name: [name]  . Number of children:  3  . Years of education: Not on file  . Highest education level: Not on file  Occupational History  . Occupation: Herbalist  Social Needs  . Financial resource strain: Not on file  . Food insecurity    Worry: Not on file    Inability: Not on file  . Transportation needs    Medical: Not on file    Non-medical: Not on file  Tobacco Use  . Smoking status: Former Smoker    Types: Cigarettes  . Smokeless tobacco: Never Used  Substance and Sexual Activity  . Alcohol use: Yes    Alcohol/week: 0.0 standard drinks    Comment: social  . Drug use: No  . Sexual activity: Not on file  Lifestyle  . Physical activity    Days per week: Not on file    Minutes per session: Not on file  . Stress: Not on file  Relationships  .  Social Herbalist on phone: Not on file    Gets together: Not on file    Attends religious service: Not on file    Active member of club or organization: Not on file    Attends meetings of clubs or organizations: Not on file    Relationship status: Not on file  Other Topics Concern  . Not on file  Social History Narrative   [place]   Married with three boys, youngest is 63 years of age. All live locally.       Likes to play golf, fish, ride motorcycle.      Family History: The patient's family history includes Alzheimer's disease in his mother; Hypertension in his mother; Stroke in his mother. There is no history of Colon cancer.  ROS:   Please see the history of present illness.    All other systems reviewed and are negative.  EKGs/Labs/Other Studies Reviewed:    The following studies were reviewed today: EKG done recently reveals sinus rhythm and nonspecific ST-T changes.   Recent Labs: 10/24/2018: ALT 19; BUN 18; Creatinine, Ser 0.97; Hemoglobin 16.4; Platelets 232.0; Potassium 4.4; Pro B Natriuretic peptide (BNP) 19.0; Sodium 141; TSH 2.50  Recent Lipid Panel    Component Value Date/Time   CHOL 228 (H) 05/23/2018 0954   TRIG 131.0 05/23/2018 0954   HDL 52.40 05/23/2018 0954   CHOLHDL 4 05/23/2018 0954   VLDL 26.2 05/23/2018 0954   LDLCALC 149 (H) 05/23/2018 0954    Physical Exam:    VS:  BP 122/66 (BP Location: Left Arm, Patient Position: Sitting, Cuff Size: Normal)   Pulse 85   Ht 5\' 6"  (1.676 m)   Wt 172 lb (78 kg)   SpO2 98%   BMI 27.76 kg/m     Wt Readings from Last 3 Encounters:  11/20/18 172 lb (78 kg)  10/24/18 175 lb (79.4 kg)  05/23/18 176 lb (79.8 kg)     GEN: Patient is in no acute distress HEENT: Normal NECK: No JVD; No carotid bruits LYMPHATICS: No lymphadenopathy CARDIAC: S1 S2 regular, 2/6 systolic murmur at the apex. RESPIRATORY:  Clear to auscultation without rales, wheezing or rhonchi  ABDOMEN:  Soft, non-tender, non-distended MUSCULOSKELETAL:  No edema; No deformity  SKIN: Warm and dry NEUROLOGIC:  Alert and oriented x 3 PSYCHIATRIC:  Normal affect    Signed, Jenean Lindau, MD  11/20/2018 3:29 PM    [facility]

## 2018-11-22 LAB — CBC
Hematocrit: 47.9 % (ref 37.5–51.0)
Hemoglobin: 16.6 g/dL (ref 13.0–17.7)
MCH: 30.9 pg (ref 26.6–33.0)
MCHC: 34.7 g/dL (ref 31.5–35.7)
MCV: 89 fL (ref 79–97)
Platelets: 224 10*3/uL (ref 150–450)
RBC: 5.37 x10E6/uL (ref 4.14–5.80)
RDW: 12.8 % (ref 11.6–15.4)
WBC: 10 10*3/uL (ref 3.4–10.8)

## 2018-11-22 LAB — D-DIMER, QUANTITATIVE: D-DIMER: 0.22 mg/L FEU (ref 0.00–0.49)

## 2018-11-23 ENCOUNTER — Telehealth: Payer: Self-pay

## 2018-11-23 NOTE — Telephone Encounter (Signed)
-----   Message from Jenean Lindau, MD sent at 11/23/2018  8:09 AM EDT ----- The results of the study is unremarkable. Please inform patient. I will discuss in detail at next appointment. Cc  primary care/referring physician Jenean Lindau, MD 11/23/2018 8:09 AM

## 2018-11-23 NOTE — Telephone Encounter (Signed)
Called and left detailed voice message on patients phone regarding lab results.

## 2018-11-27 ENCOUNTER — Telehealth (HOSPITAL_COMMUNITY): Payer: Self-pay

## 2018-11-27 NOTE — Telephone Encounter (Signed)
Spoke with the patient and his wife, instructions were given and they stated they understood and would be here for his test. Asked to call back with any questions. S.Labree EMTP

## 2018-11-30 ENCOUNTER — Ambulatory Visit (HOSPITAL_COMMUNITY): Payer: BC Managed Care – PPO | Attending: Internal Medicine

## 2018-11-30 ENCOUNTER — Other Ambulatory Visit: Payer: Self-pay

## 2018-11-30 VITALS — Ht 66.0 in | Wt 172.0 lb

## 2018-11-30 DIAGNOSIS — R0609 Other forms of dyspnea: Secondary | ICD-10-CM | POA: Diagnosis not present

## 2018-11-30 DIAGNOSIS — R079 Chest pain, unspecified: Secondary | ICD-10-CM

## 2018-11-30 DIAGNOSIS — R0600 Dyspnea, unspecified: Secondary | ICD-10-CM

## 2018-11-30 LAB — MYOCARDIAL PERFUSION IMAGING
LV dias vol: 102 mL (ref 62–150)
LV sys vol: 46 mL
Peak HR: 92 {beats}/min
Rest HR: 62 {beats}/min
SDS: 0
SRS: 0
SSS: 0
TID: 1.06

## 2018-11-30 MED ORDER — REGADENOSON 0.4 MG/5ML IV SOLN
0.4000 mg | Freq: Once | INTRAVENOUS | Status: AC
  Administered 2018-11-30: 0.4 mg via INTRAVENOUS

## 2018-11-30 MED ORDER — TECHNETIUM TC 99M TETROFOSMIN IV KIT
11.0000 | PACK | Freq: Once | INTRAVENOUS | Status: AC | PRN
  Administered 2018-11-30: 11 via INTRAVENOUS
  Filled 2018-11-30: qty 11

## 2018-11-30 MED ORDER — TECHNETIUM TC 99M TETROFOSMIN IV KIT
30.9000 | PACK | Freq: Once | INTRAVENOUS | Status: AC | PRN
  Administered 2018-11-30: 30.9 via INTRAVENOUS
  Filled 2018-11-30: qty 31

## 2018-12-01 ENCOUNTER — Telehealth: Payer: Self-pay

## 2018-12-01 NOTE — Telephone Encounter (Signed)
Called patient and left detailed voice message on patients phone regarding test results. 

## 2018-12-01 NOTE — Telephone Encounter (Signed)
-----   Message from Jenean Lindau, MD sent at 11/30/2018  3:53 PM EDT ----- The results of the study is unremarkable. Please inform patient. I will discuss in detail at next appointment. Cc  primary care/referring physician Jenean Lindau, MD 11/30/2018 3:53 PM

## 2018-12-05 ENCOUNTER — Ambulatory Visit (HOSPITAL_BASED_OUTPATIENT_CLINIC_OR_DEPARTMENT_OTHER): Payer: BC Managed Care – PPO

## 2018-12-21 ENCOUNTER — Other Ambulatory Visit: Payer: BC Managed Care – PPO

## 2018-12-22 ENCOUNTER — Ambulatory Visit: Payer: BC Managed Care – PPO | Admitting: Cardiology

## 2019-01-24 DIAGNOSIS — K7469 Other cirrhosis of liver: Secondary | ICD-10-CM | POA: Diagnosis not present

## 2019-01-29 ENCOUNTER — Other Ambulatory Visit: Payer: Self-pay | Admitting: Nurse Practitioner

## 2019-01-29 DIAGNOSIS — K7469 Other cirrhosis of liver: Secondary | ICD-10-CM

## 2019-01-30 ENCOUNTER — Ambulatory Visit: Payer: BC Managed Care – PPO | Admitting: Cardiology

## 2019-03-23 ENCOUNTER — Ambulatory Visit
Admission: RE | Admit: 2019-03-23 | Discharge: 2019-03-23 | Disposition: A | Discharge status: Home / Self Care | Payer: BC Managed Care – PPO | Source: Ambulatory Visit | Attending: Nurse Practitioner | Admitting: Nurse Practitioner

## 2019-03-23 DIAGNOSIS — K7460 Unspecified cirrhosis of liver: Secondary | ICD-10-CM | POA: Diagnosis not present

## 2019-03-23 DIAGNOSIS — K7469 Other cirrhosis of liver: Secondary | ICD-10-CM

## 2019-04-16 ENCOUNTER — Other Ambulatory Visit: Payer: Self-pay

## 2019-04-16 DIAGNOSIS — Z20822 Contact with and (suspected) exposure to covid-19: Secondary | ICD-10-CM

## 2019-04-17 ENCOUNTER — Encounter: Payer: Self-pay | Admitting: Adult Health

## 2019-04-17 LAB — NOVEL CORONAVIRUS, NAA: SARS-CoV-2, NAA: DETECTED — AB

## 2019-05-15 ENCOUNTER — Ambulatory Visit: Payer: BC Managed Care – PPO | Admitting: Gastroenterology

## 2019-05-30 ENCOUNTER — Encounter: Payer: Self-pay | Admitting: Adult Health

## 2019-07-24 ENCOUNTER — Encounter: Payer: Self-pay | Admitting: Gastroenterology

## 2019-08-01 ENCOUNTER — Other Ambulatory Visit: Payer: Self-pay | Admitting: Nurse Practitioner

## 2019-08-01 DIAGNOSIS — K7469 Other cirrhosis of liver: Secondary | ICD-10-CM

## 2019-08-07 ENCOUNTER — Ambulatory Visit
Admission: RE | Admit: 2019-08-07 | Discharge: 2019-08-07 | Disposition: A | Discharge status: Home / Self Care | Payer: BC Managed Care – PPO | Source: Ambulatory Visit | Attending: Nurse Practitioner | Admitting: Nurse Practitioner

## 2019-08-07 DIAGNOSIS — K7469 Other cirrhosis of liver: Secondary | ICD-10-CM

## 2019-08-07 DIAGNOSIS — K7460 Unspecified cirrhosis of liver: Secondary | ICD-10-CM | POA: Diagnosis not present

## 2019-08-07 DIAGNOSIS — K824 Cholesterolosis of gallbladder: Secondary | ICD-10-CM | POA: Diagnosis not present

## 2019-08-08 DIAGNOSIS — K7469 Other cirrhosis of liver: Secondary | ICD-10-CM | POA: Diagnosis not present

## 2019-08-08 LAB — HEPATIC FUNCTION PANEL
ALT: 21 (ref 10–40)
AST: 21 (ref 14–40)
Alkaline Phosphatase: 50 (ref 25–125)
Bilirubin, Direct: 0.1 (ref 0.01–0.4)
Bilirubin, Total: 0.8

## 2019-08-08 LAB — COMPREHENSIVE METABOLIC PANEL
Albumin: 4.6 (ref 3.5–5.0)
Globulin: 3.3

## 2019-08-08 LAB — CBC AND DIFFERENTIAL
HCT: 49 (ref 41–53)
Hemoglobin: 17.1 (ref 13.5–17.5)
Platelets: 198 (ref 150–399)
WBC: 7.1

## 2019-08-08 LAB — CBC: RBC: 5.58 — AB (ref 3.87–5.11)

## 2019-08-08 LAB — BASIC METABOLIC PANEL
Creatinine: 0.9 (ref 0.6–1.3)
Sodium: 139 (ref 137–147)

## 2019-08-08 LAB — PROTIME-INR: Protime: 11.2 (ref 10.0–13.8)

## 2019-08-24 ENCOUNTER — Other Ambulatory Visit: Payer: Self-pay

## 2019-08-27 ENCOUNTER — Other Ambulatory Visit: Payer: Self-pay

## 2019-08-27 ENCOUNTER — Ambulatory Visit (INDEPENDENT_AMBULATORY_CARE_PROVIDER_SITE_OTHER): Payer: BC Managed Care – PPO | Admitting: Gastroenterology

## 2019-08-27 ENCOUNTER — Encounter: Payer: Self-pay | Admitting: Gastroenterology

## 2019-08-27 VITALS — BP 122/86 | HR 74 | Temp 97.7°F | Ht 65.5 in | Wt 174.2 lb

## 2019-08-27 DIAGNOSIS — R079 Chest pain, unspecified: Secondary | ICD-10-CM | POA: Diagnosis not present

## 2019-08-27 NOTE — Progress Notes (Signed)
Review of pertinent gastrointestinal problems: 1. Ulcerative colitis: He was told he had colitis around 2000 by Dr. Watt Climes; recent bleeding, diarrhea lead to colonoscopy October 2013 (Lasya Vetter). Incomplete colonoscopy due to inflammation, poor prep. He did have moderate left-sided colitis that ended around the splenic flexure. Biopsies of normal-appearing transverse colon were normal, biopsies of inflamed appearing left colon showed chronic inflammation. He started prednisone 40 mg once daily. Improved rapidly, changed to mesalamine but he never took this and we never heard back from him for 3-4 years. 2. Hepatitis C without cirrhosis; referred to [facility]; 2016 note shows he was offered further testing in order to proceed with treatment but I do not think he ever followed up with them.  Referred back 04/2016 by [place].  [facility] patient, they felt his hepatitis C was eradicated, he is well compensated cirrhosis.  They are following liver imaging every 6 months with ultrasound.     HPI: This is a very pleasant 64 year old man whom I last saw a little over 3 years ago  I last saw him a little over 3 years ago, January 2018 for his chronic left-sided ulcerative colitis and GERD.  He was not interested in maintenance mesalamine products.  I explained to him that it was probably better to be on a maintenance medicine to prevent flares.  He was not sure if he wanted to do that.  I recommended a repeat colonoscopy, it had been 17 years at that point since he had had a good full colonoscopy because the one he had in 2013 was poorly prepped.  He never scheduled that recommended colonoscopy.  This is the first time we have heard from him since his last office visit a little over 3 years ago.  Ultrasound March 2021 ordered by Roosevelt Locks at the [facility] liver clinic showed nodular liver without discrete tumors.  3 mm gallbladder polyp.  I was able to view a note from her dated  March 2021.  He is a well-established patient there.  She felt he had well compensated cirrhosis.  Eradicated hepatitis C with complete viral response.  He was recently in the office with Roosevelt Locks and was telling her about some exertional chest discomforts.  She referred him here.  This morning he tells me he has had at least 3 or 4 episodes of exertional chest discomfort that he describes as a pressure associated with some shortness of breath.  He tells me it is not his heart because he had stress test 2 or 3 months ago and it was negative.  I looked through his records and his stress test was actually 9 or 10 months ago and it was negative however his cardiologist wanted him to have further cardiac testing given his symptoms.  Review of systems: Pertinent positive and negative review of systems were noted in the above HPI section. All other review negative.   Past Medical History:  Diagnosis Date  . Allergy    SEASONAL  . GERD (gastroesophageal reflux disease)   . Hepatitis C   . Ulcerative colitis ([place]) 2002    Past Surgical History:  Procedure Laterality Date  . COLONOSCOPY    . KNEE ARTHROSCOPY Left   . SHOULDER SURGERY Left     Current Outpatient Medications  Medication Sig Dispense Refill  . loratadine (CLARITIN) 10 MG tablet Take 1 tablet by mouth daily as needed.    Marland Kitchen omeprazole (PRILOSEC) 20 MG capsule Take 20 mg by mouth daily.  No current facility-administered medications for this visit.    Allergies as of 08/27/2019 - Review Complete 08/27/2019  Allergen Reaction Noted  . Codeine Nausea Only 02/07/2012    Family History  Problem Relation Age of Onset  . Hypertension Mother   . Stroke Mother   . Alzheimer's disease Mother   . Leukemia Brother   . Colon cancer Neg Hx   . Colon polyps Neg Hx   . Esophageal cancer Neg Hx   . Pancreatic cancer Neg Hx   . Stomach cancer Neg Hx     Social History   Socioeconomic History  . Marital status: Married     Spouse name: [name]  . Number of children: 3  . Years of education: Not on file  . Highest education level: Not on file  Occupational History  . Occupation: Herbalist  Tobacco Use  . Smoking status: Former Smoker    Types: Cigarettes  . Smokeless tobacco: Never Used  Substance and Sexual Activity  . Alcohol use: Not Currently    Alcohol/week: 0.0 standard drinks  . Drug use: No  . Sexual activity: Not on file  Other Topics Concern  . Not on file  Social History Narrative   [place]   Married with three boys, youngest is 64 years of age. All live locally.       Likes to play golf, fish, ride motorcycle.    Social Determinants of Health   Financial Resource Strain:   . Difficulty of Paying Living Expenses:   Food Insecurity:   . Worried About Charity fundraiser in the Last Year:   . Arboriculturist in the Last Year:   Transportation Needs:   . Film/video editor (Medical):   Marland Kitchen Lack of Transportation (Non-Medical):   Physical Activity:   . Days of Exercise per Week:   . Minutes of Exercise per Session:   Stress:   . Feeling of Stress :   Social Connections:   . Frequency of Communication with Friends and Family:   . Frequency of Social Gatherings with Friends and Family:   . Attends Religious Services:   . Active Member of Clubs or Organizations:   . Attends Archivist Meetings:   Marland Kitchen Marital Status:   Intimate Partner Violence:   . Fear of Current or Ex-Partner:   . Emotionally Abused:   Marland Kitchen Physically Abused:   . Sexually Abused:      Physical Exam: BP 122/86   Pulse 74   Temp 97.7 F (36.5 C)   Ht 5' 5.5" (1.664 m)   Wt 174 lb 4 oz (79 kg)   SpO2 97%   BMI 28.56 kg/m  Constitutional: generally well-appearing Psychiatric: alert and oriented x3 Eyes: extraocular movements intact Mouth: oral pharynx moist, no lesions Neck: supple no lymphadenopathy Cardiovascular: heart regular rate and rhythm Lungs: clear to  auscultation bilaterally Abdomen: soft, nontender, nondistended, no obvious ascites, no peritoneal signs, normal bowel sounds Extremities: no lower extremity edema bilaterally Skin: no lesions on visible extremities   Assessment and plan: 63 y.o. male with exertional chest pressure associated with shortness of breath  I explained to him this is very unlikely related to his GI tract.  To be safe I recommended he resume taking proton pump inhibitor once daily but I told him the most important thing is that he get into see his cardiologist.  I offered to make an appointment for him however he wanted to discuss  with his wife before doing that.  I pointed out that his stress test was actually 9 months ago not 2 or 3 months ago and that his symptoms have worsened since then and so something may have changed in his heart.   Please see the "Patient Instructions" section for addition details about the plan.   Owens Loffler, MD [facility] 08/27/2019, 10:11 AM  Cc: Dorothyann Peng, NP  Total time on date of encounter was 45 minutes (this included time spent preparing to see the patient reviewing records; obtaining and/or reviewing separately obtained history; performing a medically appropriate exam and/or evaluation; counseling and educating the patient and family if present; ordering medications, tests or procedures if applicable; and documenting clinical information in the health record).

## 2019-08-27 NOTE — Patient Instructions (Addendum)
If you are age 64 or older, your body mass index should be between 23-30. Your Body mass index is 28.56 kg/m. If this is out of the aforementioned range listed, please consider follow up with your Primary Care Provider.  If you are age 27 or younger, your body mass index should be between 19-25. Your Body mass index is 28.56 kg/m. If this is out of the aformentioned range listed, please consider follow up with your Primary Care Provider.   You have been scheduled to follow up with Dr Agustin Cree in the [facility] location on 08/29/19 at 11:00am. Please arrive 10 mins early to the appointment.    Thank you for entrusting me with your care and choosing [facility].  Dr Ardis Hughs

## 2019-08-29 ENCOUNTER — Ambulatory Visit (INDEPENDENT_AMBULATORY_CARE_PROVIDER_SITE_OTHER): Payer: BC Managed Care – PPO | Admitting: Cardiology

## 2019-08-29 ENCOUNTER — Encounter: Payer: Self-pay | Admitting: Cardiology

## 2019-08-29 ENCOUNTER — Other Ambulatory Visit: Payer: Self-pay

## 2019-08-29 VITALS — BP 120/98 | HR 68 | Ht 65.5 in | Wt 173.0 lb

## 2019-08-29 DIAGNOSIS — R0600 Dyspnea, unspecified: Secondary | ICD-10-CM | POA: Diagnosis not present

## 2019-08-29 DIAGNOSIS — B1920 Unspecified viral hepatitis C without hepatic coma: Secondary | ICD-10-CM

## 2019-08-29 DIAGNOSIS — R0609 Other forms of dyspnea: Secondary | ICD-10-CM

## 2019-08-29 DIAGNOSIS — R072 Precordial pain: Secondary | ICD-10-CM

## 2019-08-29 DIAGNOSIS — E782 Mixed hyperlipidemia: Secondary | ICD-10-CM

## 2019-08-29 DIAGNOSIS — R079 Chest pain, unspecified: Secondary | ICD-10-CM | POA: Diagnosis not present

## 2019-08-29 MED ORDER — NITROGLYCERIN 0.4 MG SL SUBL: 0.4000 mg | SUBLINGUAL_TABLET | SUBLINGUAL | 3 refills | Status: DC | PRN

## 2019-08-29 MED ORDER — METOPROLOL TARTRATE 100 MG PO TABS: 100.0000 mg | ORAL_TABLET | ORAL | 0 refills | Status: DC

## 2019-08-29 NOTE — Progress Notes (Signed)
Cardiology Office Note:    Date:  08/29/2019   ID:  Anthony Buck, Anthony Buck 02-Nov-1955, MRN [id number]  PCP:  Anthony Peng, NP  Cardiologist:  Anthony Campus, MD    Referring MD: Anthony Peng, NP   No chief complaint on file. Still have some chest pain  History of Present Illness:    Anthony Buck is a 63 y.o. male who was originally evaluated by Dr. Geraldo Buck in the summer of last year.  At that time he presented with chest pain.  Stress test was done which was a Lexiscan which showed no evidence of ischemia.  Then recommendation was to pursue calcium score as well as echocardiogram however because of financial issue patient decided not to pursue that.  He comes today to my office because of chest pain.  He still described to have some chest pain with extreme exertion he describes duration of that he was helping his son to remodel bathroom.  He had to carry heavy bags of some materials and then developed tightness in the chest interestingly he said that the tightness lasted for about 1 hour after that.  He said he slowed down because of this and now he does not have any problems.  He goes for a walk with his wife on the regular basis he has no problem except some shortness of breath as expected but nothing worse than before.  He did not take any medication for his chest pain.  Obviously is very concerned about it.  Past Medical History:  Diagnosis Date  . Allergy    SEASONAL  . GERD (gastroesophageal reflux disease)   . Hepatitis C   . Ulcerative colitis ([place]) 2002    Past Surgical History:  Procedure Laterality Date  . COLONOSCOPY    . KNEE ARTHROSCOPY Left   . SHOULDER SURGERY Left     Current Medications: Current Meds  Medication Sig  . loratadine (CLARITIN) 10 MG tablet Take 1 tablet by mouth daily as needed.  Marland Kitchen omeprazole (PRILOSEC) 20 MG capsule Take 20 mg by mouth daily.     Allergies:   Codeine   Social History   Socioeconomic History  . Marital status:  Married    Spouse name: Anthony Buck  . Number of children: 3  . Years of education: Not on file  . Highest education level: Not on file  Occupational History  . Occupation: Herbalist  Tobacco Use  . Smoking status: Former Smoker    Types: Cigarettes  . Smokeless tobacco: Never Used  Substance and Sexual Activity  . Alcohol use: Not Currently    Alcohol/week: 0.0 standard drinks  . Drug use: No  . Sexual activity: Not on file  Other Topics Concern  . Not on file  Social History Narrative   [place]   Married with three boys, youngest is 64 years of age. All live locally.       Likes to play golf, fish, ride motorcycle.    Social Determinants of Health   Financial Resource Strain:   . Difficulty of Paying Living Expenses:   Food Insecurity:   . Worried About Charity fundraiser in the Last Year:   . Arboriculturist in the Last Year:   Transportation Needs:   . Film/video editor (Medical):   Marland Kitchen Lack of Transportation (Non-Medical):   Physical Activity:   . Days of Exercise per Week:   . Minutes of Exercise per Session:   Stress:   .  Feeling of Stress :   Social Connections:   . Frequency of Communication with Friends and Family:   . Frequency of Social Gatherings with Friends and Family:   . Attends Religious Services:   . Active Member of Clubs or Organizations:   . Attends Archivist Meetings:   Marland Kitchen Marital Status:      Family History: The patient's family history includes Alzheimer's disease in his mother; Hypertension in his mother; Leukemia in his brother; Stroke in his mother. There is no history of Colon cancer, Colon polyps, Esophageal cancer, Pancreatic cancer, or Stomach cancer. ROS:   Please see the history of present illness.    All 14 point review of systems negative except as described per history of present illness  EKGs/Labs/Other Studies Reviewed:      Recent Labs: 10/24/2018: ALT 19; BUN 18; Creatinine, Ser  0.97; Potassium 4.4; Pro B Natriuretic peptide (BNP) 19.0; Sodium 141; TSH 2.50 11/20/2018: Hemoglobin 16.6; Platelets 224  Recent Lipid Panel    Component Value Date/Time   CHOL 228 (H) 05/23/2018 0954   TRIG 131.0 05/23/2018 0954   HDL 52.40 05/23/2018 0954   CHOLHDL 4 05/23/2018 0954   VLDL 26.2 05/23/2018 0954   LDLCALC 149 (H) 05/23/2018 0954    Physical Exam:    VS:  BP (!) 120/98   Pulse 68   Ht 5' 5.5" (1.664 m)   Wt 173 lb (78.5 kg)   SpO2 98%   BMI 28.35 kg/m     Wt Readings from Last 3 Encounters:  08/29/19 173 lb (78.5 kg)  08/27/19 174 lb 4 oz (79 kg)  11/30/18 172 lb (78 kg)     GEN:  Well nourished, well developed in no acute distress HEENT: Normal NECK: No JVD; No carotid bruits LYMPHATICS: No lymphadenopathy CARDIAC: RRR, no murmurs, no rubs, no gallops RESPIRATORY:  Clear to auscultation without rales, wheezing or rhonchi  ABDOMEN: Soft, non-tender, non-distended MUSCULOSKELETAL:  No edema; No deformity  SKIN: Warm and dry LOWER EXTREMITIES: no swelling NEUROLOGIC:  Alert and oriented x 3 PSYCHIATRIC:  Normal affect   ASSESSMENT:    1. Hepatitis C virus infection without hepatic coma, unspecified chronicity   2. Precordial pain   3. Chest pain of uncertain etiology   4. Dyspnea on exertion   5. Mixed dyslipidemia    PLAN:    In order of problems listed above:  1. Chest pain which is related to exercise is suspicious for coronary artery disease however typically stays after for about 1 hour at the same time when he does regular exercises he does not have any symptoms.  I still think we required further evaluation of this problem.  We will talk about potentially doing cardiac catheterization, however, I think the best test for some would like him will be to do coronary CT angio.  I will give him prescription for nitroglycerin.  Reluctant to give him aspirin because of history of cirrhosis of the liver as well as hepatitis C.  He said he never had a  bleeding from his stomach.  But will wait for results of the test to decide about therapy. 2. Dyspnea on exertion.  In the future we discussed the issue of potentially doing a echocardiogram.  First we need to evaluate his coronary arteries. 3. Mixed dyslipidemia: His total cholesterol year ago was 228 with LDL of 149.  His HDL was 52.  Obviously this is elevated cholesterol in terms of treatment will decide after CT angio  will be done.   Medication Adjustments/Labs and Tests Ordered: Current medicines are reviewed at length with the patient today.  Concerns regarding medicines are outlined above.  Orders Placed This Encounter  Procedures  . CT CORONARY MORPH W/CTA COR W/SCORE W/CA W/CM &/OR WO/CM  . CT CORONARY FRACTIONAL FLOW RESERVE DATA PREP  . CT CORONARY FRACTIONAL FLOW RESERVE FLUID ANALYSIS  . Basic metabolic panel   Medication changes:  Meds ordered this encounter  Medications  . nitroGLYCERIN (NITROSTAT) 0.4 MG SL tablet    Sig: Place 1 tablet (0.4 mg total) under the tongue every 5 (five) minutes as needed for chest pain.    Dispense:  25 tablet    Refill:  3    Signed, Park Liter, MD, [facility] 08/29/2019 11:59 AM    [place]

## 2019-08-29 NOTE — Patient Instructions (Addendum)
Medication Instructions:  Start Nitroglycerin 0.4 mg every 5 minutes as needed for chest pain   *If you need a refill on your cardiac medications before your next appointment, please call your pharmacy*   Lab Work: Your physician recommends that you return for lab work 1 week before your CT  If you have labs (blood work) drawn today and your tests are completely normal, you will receive your results only by: Marland Kitchen MyChart Message (if you have MyChart) OR . A paper copy in the mail If you have any lab test that is abnormal or we need to change your treatment, we will call you to review the results.   Testing/Procedures: Your physician has ordered for you to have a Cardiac CT *Instructions Below*   Follow-Up: At [facility], you and your health needs are our priority.  As part of our continuing mission to provide you with exceptional heart care, we have created designated Provider Care Teams.  These Care Teams include your primary Cardiologist (physician) and Advanced Practice Providers (APPs -  Physician Assistants and Nurse Practitioners) who all work together to provide you with the care you need, when you need it.  We recommend signing up for the patient portal called "MyChart".  Sign up information is provided on this After Visit Summary.  MyChart is used to connect with patients for Virtual Visits (Telemedicine).  Patients are able to view lab/test results, encounter notes, upcoming appointments, etc.  Non-urgent messages can be sent to your provider as well.   To learn more about what you can do with MyChart, go to [email or web address].    Your next appointment:   1 month(s)  The format for your next appointment:   In Person  Provider:   Jenne Campus, MD   Other Instructions Your cardiac CT will be scheduled at one of the below locations:   [facility], [place] [address] [place], [place] [address] [phone]  If scheduled at [facility],  please arrive at the [facility] main entrance of [facility] 30 minutes prior to test start time. Proceed to the [facility] Radiology Department (first floor) to check-in and test prep.  Please follow these instructions carefully (unless otherwise directed):  Hold all erectile dysfunction medications at least 3 days (72 hrs) prior to test.  On the Night Before the Test: . Be sure to Drink plenty of water. . Do not consume any caffeinated/decaffeinated beverages or chocolate 12 hours prior to your test. . Do not take any antihistamines 12 hours prior to your test.   On the Day of the Test: . Drink plenty of water. Do not drink any water within one hour of the test. . Do not eat any food 4 hours prior to the test. . You may take your regular medications prior to the test.  . Take metoprolol (Lopressor) two hours prior to test.        After the Test: . Drink plenty of water. . After receiving IV contrast, you may experience a mild flushed feeling. This is normal. . On occasion, you may experience a mild rash up to 24 hours after the test. This is not dangerous. If this occurs, you can take Benadryl 25 mg and increase your fluid intake. . If you experience trouble breathing, this can be serious. If it is severe call 911 IMMEDIATELY. If it is mild, please call our office.  Once we have confirmed authorization from your insurance company, we will call you to  set up a date and time for your test.   For non-scheduling related questions, please contact the cardiac imaging nurse navigator should you have any questions/concerns: Marchia Bond, RN Navigator Cardiac Imaging [facility] [phone] office  For scheduling needs, including cancellations and rescheduling, please call [phone].

## 2019-09-07 ENCOUNTER — Encounter: Payer: Self-pay | Admitting: Family Medicine

## 2019-09-28 ENCOUNTER — Ambulatory Visit: Payer: BC Managed Care – PPO | Admitting: Cardiology

## 2019-10-18 ENCOUNTER — Other Ambulatory Visit (INDEPENDENT_AMBULATORY_CARE_PROVIDER_SITE_OTHER): Payer: BC Managed Care – PPO

## 2019-10-18 ENCOUNTER — Ambulatory Visit (INDEPENDENT_AMBULATORY_CARE_PROVIDER_SITE_OTHER): Payer: BC Managed Care – PPO | Admitting: Adult Health

## 2019-10-18 ENCOUNTER — Other Ambulatory Visit: Payer: Self-pay

## 2019-10-18 ENCOUNTER — Encounter: Payer: Self-pay | Admitting: Adult Health

## 2019-10-18 VITALS — BP 116/84 | Temp 97.9°F | Ht 66.0 in | Wt 172.0 lb

## 2019-10-18 DIAGNOSIS — R972 Elevated prostate specific antigen [PSA]: Secondary | ICD-10-CM

## 2019-10-18 DIAGNOSIS — E782 Mixed hyperlipidemia: Secondary | ICD-10-CM

## 2019-10-18 DIAGNOSIS — R0602 Shortness of breath: Secondary | ICD-10-CM

## 2019-10-18 DIAGNOSIS — R5383 Other fatigue: Secondary | ICD-10-CM | POA: Diagnosis not present

## 2019-10-18 DIAGNOSIS — Z0000 Encounter for general adult medical examination without abnormal findings: Secondary | ICD-10-CM | POA: Diagnosis not present

## 2019-10-18 LAB — CBC WITH DIFFERENTIAL/PLATELET
Basophils Absolute: 0 10*3/uL (ref 0.0–0.1)
Basophils Relative: 0.7 % (ref 0.0–3.0)
Eosinophils Absolute: 0.2 10*3/uL (ref 0.0–0.7)
Eosinophils Relative: 2.6 % (ref 0.0–5.0)
HCT: 47 % (ref 39.0–52.0)
Hemoglobin: 16.2 g/dL (ref 13.0–17.0)
Lymphocytes Relative: 30.2 % (ref 12.0–46.0)
Lymphs Abs: 2.1 10*3/uL (ref 0.7–4.0)
MCHC: 34.4 g/dL (ref 30.0–36.0)
MCV: 89.6 fl (ref 78.0–100.0)
Monocytes Absolute: 0.5 10*3/uL (ref 0.1–1.0)
Monocytes Relative: 6.8 % (ref 3.0–12.0)
Neutro Abs: 4.2 10*3/uL (ref 1.4–7.7)
Neutrophils Relative %: 59.7 % (ref 43.0–77.0)
Platelets: 230 10*3/uL (ref 150.0–400.0)
RBC: 5.25 Mil/uL (ref 4.22–5.81)
RDW: 13 % (ref 11.5–15.5)
WBC: 7.1 10*3/uL (ref 4.0–10.5)

## 2019-10-18 LAB — LIPID PANEL
Cholesterol: 201 mg/dL — ABNORMAL HIGH (ref 0–200)
HDL: 44.3 mg/dL (ref >39.00)
LDL Cholesterol: 136 mg/dL — ABNORMAL HIGH (ref 0–99)
NonHDL: 156.54
Total CHOL/HDL Ratio: 5
Triglycerides: 104 mg/dL (ref 0.0–149.0)
VLDL: 20.8 mg/dL (ref 0.0–40.0)

## 2019-10-18 LAB — COMPREHENSIVE METABOLIC PANEL
ALT: 18 U/L (ref 0–53)
AST: 20 U/L (ref 0–37)
Albumin: 4.7 g/dL (ref 3.5–5.2)
Alkaline Phosphatase: 55 U/L (ref 39–117)
BUN: 20 mg/dL (ref 6–23)
CO2: 26 mEq/L (ref 19–32)
Calcium: 10.2 mg/dL (ref 8.4–10.5)
Chloride: 102 mEq/L (ref 96–112)
Creatinine, Ser: 1.03 mg/dL (ref 0.40–1.50)
GFR: 72.69 mL/min (ref >60.00)
Glucose, Bld: 108 mg/dL — ABNORMAL HIGH (ref 70–99)
Potassium: 4.5 mEq/L (ref 3.5–5.1)
Sodium: 137 mEq/L (ref 135–145)
Total Bilirubin: 0.7 mg/dL (ref 0.2–1.2)
Total Protein: 7.5 g/dL (ref 6.0–8.3)

## 2019-10-18 LAB — TSH: TSH: 2.71 u[IU]/mL (ref 0.35–4.50)

## 2019-10-18 LAB — PSA: PSA: 4.62 ng/mL — ABNORMAL HIGH (ref 0.10–4.00)

## 2019-10-18 NOTE — Progress Notes (Signed)
Subjective:    Patient ID: Anthony Buck, male    DOB: [birth year], 64 y.o.   MRN: [id number]  HPI Patient presents for yearly preventative medicine examination. He is a pleasant 64 year old male who  has a past medical history of Allergy, GERD (gastroesophageal reflux disease), Hepatitis C, and Ulcerative colitis ([place]) (2002).   He has retired on July 09 2019   History of Hep C -he was seen by [facility] hep C clinic underwent 12 weeks of treatment with [place] in 2019.  On completion of treatment he had no detectable HCVRNA.  He continues to be seen every 6 months  Elevated PSA -during his last CPE in January 2020 his PSA had decreased from 6.31-5.06.  I did end up referring him to urology for further evaluation. He never was seen by Urology.  He denies nocturia, incomplete bladder emptying, or dribbling.  He does report some intermittent episodes with decreased stream but this is not a bother to him  SOB with Exertion - is being worked up by Cardiology. Has a coronary CT next week.  He reports that he really has not had any shortness of breath with exertion recently.  Does report intermittent as of left-sided chest pain that is felt as a twinge, but this has not happened recently either  Fatigue - long standing issue. He would like to have his testosterone checked today. He denies issues with ED.   Hyperlipidemia -has history of.  He is not currently on a statin as he wanted to work on lifestyle modifications first. Lab Results  Component Value Date   CHOL 228 (H) 05/23/2018   HDL 52.40 05/23/2018   LDLCALC 149 (H) 05/23/2018   TRIG 131.0 05/23/2018   CHOLHDL 4 05/23/2018   All immunizations and health maintenance protocols were reviewed with the patient and needed orders were placed.  Appropriate screening laboratory values were ordered for the patient including screening of hyperlipidemia, renal function and hepatic function. If indicated by BPH, a PSA was ordered.  Medication  reconciliation,  past medical history, social history, problem list and allergies were reviewed in detail with the patient  Goals were established with regard to weight loss, exercise, and  diet in compliance with medications. He is staying active and walking with his wife on a regular basis, he tries to eat a heart healthy diet  Wt Readings from Last 3 Encounters:  10/18/19 172 lb (78 kg)  08/29/19 173 lb (78.5 kg)  08/27/19 174 lb 4 oz (79 kg)    Review of Systems  Constitutional: Negative.   HENT: Negative.   Eyes: Negative.   Respiratory: Positive for shortness of breath.   Cardiovascular: Positive for chest pain.  Gastrointestinal: Negative.   Endocrine: Negative.   Genitourinary: Negative.   Musculoskeletal: Negative.   Skin: Negative.   Allergic/Immunologic: Negative.   Neurological: Negative.   Hematological: Negative.   Psychiatric/Behavioral: Negative.   All other systems reviewed and are negative.  Past Medical History:  Diagnosis Date   Allergy    SEASONAL   GERD (gastroesophageal reflux disease)    Hepatitis C    Ulcerative colitis ([place]) 2002    Social History   Socioeconomic History   Marital status: Married    Spouse name: [name]   Number of children: 3   Years of education: Not on file   Highest education level: Not on file  Occupational History   Occupation: Herbalist  Tobacco Use   Smoking status: Former Smoker  Types: Cigarettes   Smokeless tobacco: Never Used  Vaping Use   Vaping Use: Never used  Substance and Sexual Activity   Alcohol use: Not Currently    Alcohol/week: 0.0 standard drinks   Drug use: No   Sexual activity: Not on file  Other Topics Concern   Not on file  Social History Narrative   [place]   Married with three boys, youngest is 64 years of age. All live locally.       Likes to play golf, fish, ride motorcycle.    Social Determinants of Health   Financial Resource  Strain:    Difficulty of Paying Living Expenses:   Food Insecurity:    Worried About Charity fundraiser in the Last Year:    Arboriculturist in the Last Year:   Transportation Needs:    Film/video editor (Medical):    Lack of Transportation (Non-Medical):   Physical Activity:    Days of Exercise per Week:    Minutes of Exercise per Session:   Stress:    Feeling of Stress :   Social Connections:    Frequency of Communication with Friends and Family:    Frequency of Social Gatherings with Friends and Family:    Attends Religious Services:    Active Member of Clubs or Organizations:    Attends Music therapist:    Marital Status:   Intimate Partner Violence:    Fear of Current or Ex-Partner:    Emotionally Abused:    Physically Abused:    Sexually Abused:     Past Surgical History:  Procedure Laterality Date   COLONOSCOPY     KNEE ARTHROSCOPY Left    SHOULDER SURGERY Left     Family History  Problem Relation Age of Onset   Hypertension Mother    Stroke Mother    Alzheimer's disease Mother    Leukemia Brother    Colon cancer Neg Hx    Colon polyps Neg Hx    Esophageal cancer Neg Hx    Pancreatic cancer Neg Hx    Stomach cancer Neg Hx     Allergies  Allergen Reactions   Codeine Nausea Only    Current Outpatient Medications on File Prior to Visit  Medication Sig Dispense Refill   loratadine (CLARITIN) 10 MG tablet Take 1 tablet by mouth daily as needed.     omeprazole (PRILOSEC) 20 MG capsule Take 20 mg by mouth daily.     No current facility-administered medications on file prior to visit.    BP 116/84    Temp 97.9 F (36.6 C)    Ht 5\' 6"  (1.676 m)    Wt 172 lb (78 kg)    BMI 27.76 kg/m       Objective:   Physical Exam Vitals and nursing note reviewed.  Constitutional:      General: He is not in acute distress.    Appearance: Normal appearance. He is well-developed and normal weight.  HENT:      Head: Normocephalic and atraumatic.     Right Ear: Tympanic membrane, ear canal and external ear normal. There is no impacted cerumen.     Left Ear: Tympanic membrane, ear canal and external ear normal. There is no impacted cerumen.     Nose: Nose normal. No congestion or rhinorrhea.     Mouth/Throat:     Mouth: Mucous membranes are moist.     Pharynx: Oropharynx is clear. No oropharyngeal exudate  or posterior oropharyngeal erythema.  Eyes:     General:        Right eye: No discharge.        Left eye: No discharge.     Extraocular Movements: Extraocular movements intact.     Conjunctiva/sclera: Conjunctivae normal.     Pupils: Pupils are equal, round, and reactive to light.  Neck:     Vascular: No carotid bruit.     Trachea: No tracheal deviation.  Cardiovascular:     Rate and Rhythm: Normal rate and regular rhythm.     Pulses: Normal pulses.     Heart sounds: Normal heart sounds. No murmur heard.  No friction rub. No gallop.   Pulmonary:     Effort: Pulmonary effort is normal. No respiratory distress.     Breath sounds: Normal breath sounds. No stridor. No wheezing, rhonchi or rales.  Chest:     Chest wall: No tenderness.  Abdominal:     General: Bowel sounds are normal. There is no distension.     Palpations: Abdomen is soft. There is no mass.     Tenderness: There is no abdominal tenderness. There is no right CVA tenderness, left CVA tenderness, guarding or rebound.     Hernia: No hernia is present.  Musculoskeletal:        General: No swelling, tenderness, deformity or signs of injury. Normal range of motion.     Right lower leg: No edema.     Left lower leg: No edema.  Lymphadenopathy:     Cervical: No cervical adenopathy.  Skin:    General: Skin is warm and dry.     Capillary Refill: Capillary refill takes less than 2 seconds.     Coloration: Skin is not jaundiced or pale.     Findings: No bruising, erythema, lesion or rash.  Neurological:     General: No focal  deficit present.     Mental Status: He is alert and oriented to person, place, and time.     Cranial Nerves: No cranial nerve deficit.     Sensory: No sensory deficit.     Motor: No weakness.     Coordination: Coordination normal.     Gait: Gait normal.     Deep Tendon Reflexes: Reflexes normal.  Psychiatric:        Mood and Affect: Mood normal.        Behavior: Behavior normal.        Thought Content: Thought content normal.        Judgment: Judgment normal.       Assessment & Plan:   1. Routine general medical examination at a health care facility - Continue to exercise and eat healthy  - Follow up in one year or sooner if needed - CBC with Differential/Platelet; Future - Comprehensive metabolic panel; Future - Lipid panel; Future - PSA; Future - TSH; Future  2. Elevated PSA - Would like him to see Urology if PSA continues to be elevated - PSA; Future  3. Shortness of breath - Continue workup by cardiology   4. Mixed dyslipidemia - Likely need to be placed on statin  - CBC with Differential/Platelet; Future - Comprehensive metabolic panel; Future - Lipid panel; Future - TSH; Future  5. Fatigue, unspecified type -  - Testosterone Total,Free,Bio, Males; Future  Dorothyann Peng, NP

## 2019-10-18 NOTE — Patient Instructions (Signed)
It was great seeing you today   We will follow up with you once your lab work is back   Health Maintenance, Male A healthy lifestyle and preventative care can promote health and wellness.  Maintain regular health, dental, and eye exams.  Eat a healthy diet. Foods like vegetables, fruits, whole grains, low-fat dairy products, and lean protein foods contain the nutrients you need and are low in calories. Decrease your intake of foods high in solid fats, added sugars, and salt. Get information about a proper diet from your health care provider, if necessary.  Regular physical exercise is one of the most important things you can do for your health. Most adults should get at least 150 minutes of moderate-intensity exercise (any activity that increases your heart rate and causes you to sweat) each week. In addition, most adults need muscle-strengthening exercises on 2 or more days a week.   Maintain a healthy weight. The body mass index (BMI) is a screening tool to identify possible weight problems. It provides an estimate of body fat based on height and weight. Your health care provider can find your BMI and can help you achieve or maintain a healthy weight. For males 20 years and older:  A BMI below 18.5 is considered underweight.  A BMI of 18.5 to 24.9 is normal.  A BMI of 25 to 29.9 is considered overweight.  A BMI of 30 and above is considered obese.  Maintain normal blood lipids and cholesterol by exercising and minimizing your intake of saturated fat. Eat a balanced diet with plenty of fruits and vegetables. Blood tests for lipids and cholesterol should begin at age 16 and be repeated every 5 years. If your lipid or cholesterol levels are high, you are over age 27, or you are at high risk for heart disease, you may need your cholesterol levels checked more frequently.Ongoing high lipid and cholesterol levels should be treated with medicines if diet and exercise are not working.  If you  smoke, find out from your health care provider how to quit. If you do not use tobacco, do not start.  Lung cancer screening is recommended for adults aged 54-80 years who are at high risk for developing lung cancer because of a history of smoking. A yearly low-dose CT scan of the lungs is recommended for people who have at least a 30-pack-year history of smoking and are current smokers or have quit within the past 15 years. A pack year of smoking is smoking an average of 1 pack of cigarettes a day for 1 year (for example, a 30-pack-year history of smoking could mean smoking 1 pack a day for 30 years or 2 packs a day for 15 years). Yearly screening should continue until the smoker has stopped smoking for at least 15 years. Yearly screening should be stopped for people who develop a health problem that would prevent them from having lung cancer treatment.  If you choose to drink alcohol, do not have more than 2 drinks per day. One drink is considered to be 12 oz (360 mL) of beer, 5 oz (150 mL) of wine, or 1.5 oz (45 mL) of liquor.  Avoid the use of street drugs. Do not share needles with anyone. Ask for help if you need support or instructions about stopping the use of drugs.  High blood pressure causes heart disease and increases the risk of stroke. High blood pressure is more likely to develop in:  People who have blood pressure in the  end of the normal range (100-139/85-89 mm Hg).  People who are overweight or obese.  People who are African American.  If you are 29-50 years of age, have your blood pressure checked every 3-5 years. If you are 98 years of age or older, have your blood pressure checked every year. You should have your blood pressure measured twice--once when you are at a hospital or clinic, and once when you are not at a hospital or clinic. Record the average of the two measurements. To check your blood pressure when you are not at a hospital or clinic, you can use:  An automated  blood pressure machine at a pharmacy.  A home blood pressure monitor.  If you are 28-59 years old, ask your health care provider if you should take aspirin to prevent heart disease.  Diabetes screening involves taking a blood sample to check your fasting blood sugar level. This should be done once every 3 years after age 24 if you are at a normal weight and without risk factors for diabetes. Testing should be considered at a younger age or be carried out more frequently if you are overweight and have at least 1 risk factor for diabetes.  Colorectal cancer can be detected and often prevented. Most routine colorectal cancer screening begins at the age of 42 and continues through age 82. However, your health care provider may recommend screening at an earlier age if you have risk factors for colon cancer. On a yearly basis, your health care provider may provide home test kits to check for hidden blood in the stool. A small camera at the end of a tube may be used to directly examine the colon (sigmoidoscopy or colonoscopy) to detect the earliest forms of colorectal cancer. Talk to your health care provider about this at age 96 when routine screening begins. A direct exam of the colon should be repeated every 5-10 years through age 5, unless early forms of precancerous polyps or small growths are found.  People who are at an increased risk for hepatitis B should be screened for this virus. You are considered at high risk for hepatitis B if:  You were born in a country where hepatitis B occurs often. Talk with your health care provider about which countries are considered high risk.  Your parents were born in a high-risk country and you have not received a shot to protect against hepatitis B (hepatitis B vaccine).  You have HIV or AIDS.  You use needles to inject street drugs.  You live with, or have sex with, someone who has hepatitis B.  You are a man who has sex with other men (MSM).  You get  hemodialysis treatment.  You take certain medicines for conditions like cancer, organ transplantation, and autoimmune conditions.  Hepatitis C blood testing is recommended for all people born from [birth year] through 1965 and any individual with known risk factors for hepatitis C.  Healthy men should no longer receive prostate-specific antigen (PSA) blood tests as part of routine cancer screening. Talk to your health care provider about prostate cancer screening.  Testicular cancer screening is not recommended for adolescents or adult males who have no symptoms. Screening includes self-exam, a health care provider exam, and other screening tests. Consult with your health care provider about any symptoms you have or any concerns you have about testicular cancer.  Practice safe sex. Use condoms and avoid high-risk sexual practices to reduce the spread of sexually transmitted infections (STIs).  You should  be screened for STIs, including gonorrhea and chlamydia if:  You are sexually active and are younger than 24 years.  You are older than 24 years, and your health care provider tells you that you are at risk for this type of infection.  Your sexual activity has changed since you were last screened, and you are at an increased risk for chlamydia or gonorrhea. Ask your health care provider if you are at risk.  If you are at risk of being infected with HIV, it is recommended that you take a prescription medicine daily to prevent HIV infection. This is called pre-exposure prophylaxis (PrEP). You are considered at risk if:  You are a man who has sex with other men (MSM).  You are a heterosexual man who is sexually active with multiple partners.  You take drugs by injection.  You are sexually active with a partner who has HIV.  Talk with your health care provider about whether you are at high risk of being infected with HIV. If you choose to begin PrEP, you should first be tested for HIV. You should  then be tested every 3 months for as long as you are taking PrEP.  Use sunscreen. Apply sunscreen liberally and repeatedly throughout the day. You should seek shade when your shadow is shorter than you. Protect yourself by wearing long sleeves, pants, a wide-brimmed hat, and sunglasses year round whenever you are outdoors.  Tell your health care provider of new moles or changes in moles, especially if there is a change in shape or color. Also, tell your health care provider if a mole is larger than the size of a pencil eraser.  A one-time screening for abdominal aortic aneurysm (AAA) and surgical repair of large AAAs by ultrasound is recommended for men aged 65-75 years who are current or former smokers.  Stay current with your vaccines (immunizations).   This information is not intended to replace advice given to you by your health care provider. Make sure you discuss any questions you have with your health care provider.   Document Released: 10/23/2007 Document Revised: 05/17/2014 Document Reviewed: 09/21/2010 Elsevier Interactive Patient Education [employer].

## 2019-10-20 LAB — TESTOSTERONE TOTAL,FREE,BIO, MALES
Albumin: 4.6 g/dL (ref 3.6–5.1)
Sex Hormone Binding: 35 nmol/L (ref 22–77)
Testosterone, Bioavailable: 131.6 ng/dL (ref >=110.0)
Testosterone, Free: 62.7 pg/mL (ref 46.0–224.0)
Testosterone: 492 ng/dL (ref 250–827)

## 2019-10-22 ENCOUNTER — Telehealth (HOSPITAL_COMMUNITY): Payer: Self-pay | Admitting: *Deleted

## 2019-10-22 ENCOUNTER — Other Ambulatory Visit (HOSPITAL_COMMUNITY): Payer: Self-pay | Admitting: *Deleted

## 2019-10-22 MED ORDER — METOPROLOL TARTRATE 100 MG PO TABS
ORAL_TABLET | ORAL | 0 refills | Status: AC
Start: 2019-10-22 — End: ?

## 2019-10-22 NOTE — Telephone Encounter (Signed)
Pt reaching out regarding upcoming cardiac imaging study; pt verbalizes understanding of appt date/time, parking situation and where to check in, pre-test NPO status and medications ordered, and verified current allergies; name and call back number provided for further questions should they arise ° ° Tai RN Navigator Cardiac Imaging °[facility] °[phone] office °[phone] cell ° °

## 2019-10-23 ENCOUNTER — Telehealth: Payer: Self-pay | Admitting: Cardiology

## 2019-10-23 NOTE — Telephone Encounter (Signed)
       Pt would like to know what will be the doctor cost for his CT. He says he knows what the facility will cost.

## 2019-10-24 ENCOUNTER — Ambulatory Visit (HOSPITAL_COMMUNITY): Admission: RE | Admit: 2019-10-24 | Payer: BC Managed Care – PPO | Source: Ambulatory Visit

## 2019-10-24 ENCOUNTER — Encounter (HOSPITAL_COMMUNITY): Payer: Self-pay

## 2019-12-26 ENCOUNTER — Other Ambulatory Visit: Payer: Self-pay | Admitting: Adult Health

## 2019-12-26 DIAGNOSIS — G4700 Insomnia, unspecified: Secondary | ICD-10-CM

## 2020-03-12 ENCOUNTER — Other Ambulatory Visit: Payer: Self-pay | Admitting: Nurse Practitioner

## 2020-03-12 DIAGNOSIS — K7469 Other cirrhosis of liver: Secondary | ICD-10-CM

## 2020-03-25 ENCOUNTER — Ambulatory Visit
Admission: RE | Admit: 2020-03-25 | Discharge: 2020-03-25 | Disposition: A | Discharge status: Home / Self Care | Payer: BC Managed Care – PPO | Source: Ambulatory Visit | Attending: Nurse Practitioner | Admitting: Nurse Practitioner

## 2020-03-25 DIAGNOSIS — K7469 Other cirrhosis of liver: Secondary | ICD-10-CM

## 2020-03-25 DIAGNOSIS — K7689 Other specified diseases of liver: Secondary | ICD-10-CM | POA: Diagnosis not present

## 2020-07-29 DIAGNOSIS — F329 Major depressive disorder, single episode, unspecified: Secondary | ICD-10-CM | POA: Diagnosis not present

## 2020-07-29 DIAGNOSIS — R0600 Dyspnea, unspecified: Secondary | ICD-10-CM | POA: Diagnosis not present

## 2020-07-29 DIAGNOSIS — R5383 Other fatigue: Secondary | ICD-10-CM | POA: Diagnosis not present

## 2020-09-08 ENCOUNTER — Other Ambulatory Visit: Payer: Self-pay | Admitting: Nurse Practitioner

## 2020-09-08 DIAGNOSIS — D376 Neoplasm of uncertain behavior of liver, gallbladder and bile ducts: Secondary | ICD-10-CM

## 2020-09-08 DIAGNOSIS — K7469 Other cirrhosis of liver: Secondary | ICD-10-CM

## 2020-09-25 ENCOUNTER — Other Ambulatory Visit: Payer: BC Managed Care – PPO

## 2020-09-26 ENCOUNTER — Ambulatory Visit
Admission: RE | Admit: 2020-09-26 | Discharge: 2020-09-26 | Disposition: A | Discharge status: Home / Self Care | Payer: Self-pay | Source: Ambulatory Visit | Attending: Nurse Practitioner | Admitting: Nurse Practitioner

## 2020-09-26 ENCOUNTER — Other Ambulatory Visit: Payer: Self-pay

## 2020-09-26 DIAGNOSIS — D376 Neoplasm of uncertain behavior of liver, gallbladder and bile ducts: Secondary | ICD-10-CM

## 2020-09-26 DIAGNOSIS — K7469 Other cirrhosis of liver: Secondary | ICD-10-CM

## 2020-09-26 MED ORDER — GADOBENATE DIMEGLUMINE 529 MG/ML IV SOLN
16.0000 mL | Freq: Once | INTRAVENOUS | Status: AC | PRN
  Administered 2020-09-26: 16 mL via INTRAVENOUS

## 2020-09-27 ENCOUNTER — Other Ambulatory Visit: Payer: BC Managed Care – PPO

## 2021-03-06 ENCOUNTER — Other Ambulatory Visit: Payer: Self-pay | Admitting: Urology

## 2021-03-06 DIAGNOSIS — R972 Elevated prostate specific antigen [PSA]: Secondary | ICD-10-CM

## 2021-03-11 ENCOUNTER — Other Ambulatory Visit: Payer: Self-pay | Admitting: Nurse Practitioner

## 2021-03-11 DIAGNOSIS — K7469 Other cirrhosis of liver: Secondary | ICD-10-CM

## 2021-03-11 DIAGNOSIS — D376 Neoplasm of uncertain behavior of liver, gallbladder and bile ducts: Secondary | ICD-10-CM

## 2021-03-31 ENCOUNTER — Inpatient Hospital Stay: Admission: RE | Admit: 2021-03-31 | Payer: Medicare Other | Source: Ambulatory Visit

## 2021-07-21 ENCOUNTER — Other Ambulatory Visit: Payer: Self-pay

## 2021-07-21 ENCOUNTER — Encounter (HOSPITAL_BASED_OUTPATIENT_CLINIC_OR_DEPARTMENT_OTHER): Payer: Self-pay | Admitting: Orthopedic Surgery

## 2021-07-21 NOTE — Progress Notes (Addendum)
Spoke w/ via phone for pre-op interview--- Ronalee Belts ?Lab needs dos---- NONE Per anesthesia, surgeon orders pending.             ?Lab results------ ?COVID test -----patient states asymptomatic no test needed ?Arrive at -------1245 ?NPO after MN NO Solid Food.  Clear liquids from MN until---1145 ?Med rec completed ?Medications to take morning of surgery -----Claritin, Wellbutrin and Prilosec. ?Diabetic medication ----- ?Patient instructed no nail polish to be worn day of surgery ?Patient instructed to bring photo id and insurance card day of surgery ?Patient aware to have Driver (ride ) / caregiver Wife Tevyn Codd    for 24 hours after surgery  ?Patient Special Instructions ----- ?Pre-Op special Istructions ----- ?Patient verbalized understanding of instructions that were given at this phone interview. ?Patient denies shortness of breath, chest pain, fever, cough at this phone interview.  ?

## 2021-07-24 NOTE — Progress Notes (Signed)
Left voice mail on Patient's phone of time change for Monday. To come in at 1115 clear liquids until 1015 ?

## 2021-07-26 NOTE — H&P (Signed)
Preoperative History & Physical Exam ? ?Surgeon: Matt Holmes, MD ? ?Diagnosis: carpal tunnel syndrome ? ?Planned Procedure: Procedure(s) (LRB): ?CARPAL TUNNEL RELEASE,right (Right) ? ?History of Present Illness:   ?Patient is a 66 y.o. male with symptoms consistent with carpal tunnel syndrome who presents for surgical intervention. The risks, benefits and alternatives of surgical intervention were discussed and informed consent was obtained prior to surgery. ? ?Past Medical History:  ?Past Medical History:  ?Diagnosis Date  ? Allergy   ? SEASONAL  ? GERD (gastroesophageal reflux disease)   ? Hepatitis C   ? Ulcerative colitis ([place]) 2002  ? ? ?Past Surgical History:  ?Past Surgical History:  ?Procedure Laterality Date  ? COLONOSCOPY    ? KNEE ARTHROSCOPY Left   ? SHOULDER SURGERY Left   ? ? ?Medications:  ?Prior to Admission medications   ?Medication Sig Start Date End Date Taking? Authorizing Provider  ?buPROPion (WELLBUTRIN XL) 150 MG 24 hr tablet Take 150 mg by mouth daily.   Yes [provider]  ?loratadine (CLARITIN) 10 MG tablet Take 1 tablet by mouth daily as needed.   Yes [provider]  ?omeprazole (PRILOSEC) 20 MG capsule Take 20 mg by mouth daily.   Yes [provider]  ?metoprolol tartrate (LOPRESSOR) 100 MG tablet Take 1 tablet by mouth 2 hours before cardiac CT scan. 10/22/19   Park Liter, MD  ? ? ?Allergies:  Codeine ? ?Review of Systems: Negative except per HPI. ? ?Physical Exam: ?Alert and oriented, NAD ?Head and neck: no masses, normal alignment ?CV: pulse intact ?Pulm: no increased work of breathing, respirations even and unlabored ?Abdomen: non-distended ?Extremities: extremities warm and well perfused ? ?LABS: ?No results found for this or any previous visit (from the past 2160 hour(s)).  ? ?Complete History and Physical exam available in the office notes ? ?[name] ? ?

## 2021-07-27 ENCOUNTER — Other Ambulatory Visit: Payer: Self-pay

## 2021-07-27 ENCOUNTER — Encounter (HOSPITAL_BASED_OUTPATIENT_CLINIC_OR_DEPARTMENT_OTHER): Payer: Self-pay | Admitting: Orthopedic Surgery

## 2021-07-27 ENCOUNTER — Ambulatory Visit (HOSPITAL_BASED_OUTPATIENT_CLINIC_OR_DEPARTMENT_OTHER): Payer: Medicare Other | Admitting: Anesthesiology

## 2021-07-27 ENCOUNTER — Encounter (HOSPITAL_BASED_OUTPATIENT_CLINIC_OR_DEPARTMENT_OTHER)
Admission: RE | Disposition: A | Discharge status: Home / Self Care | Payer: Self-pay | Source: Home / Self Care | Attending: Orthopedic Surgery

## 2021-07-27 ENCOUNTER — Ambulatory Visit (HOSPITAL_BASED_OUTPATIENT_CLINIC_OR_DEPARTMENT_OTHER)
Admission: RE | Admit: 2021-07-27 | Discharge: 2021-07-27 | Disposition: A | Discharge status: Home / Self Care | Payer: Medicare Other | Attending: Orthopedic Surgery | Admitting: Orthopedic Surgery

## 2021-07-27 DIAGNOSIS — F32A Depression, unspecified: Secondary | ICD-10-CM | POA: Insufficient documentation

## 2021-07-27 DIAGNOSIS — J449 Chronic obstructive pulmonary disease, unspecified: Secondary | ICD-10-CM | POA: Insufficient documentation

## 2021-07-27 DIAGNOSIS — G5601 Carpal tunnel syndrome, right upper limb: Secondary | ICD-10-CM | POA: Diagnosis present

## 2021-07-27 DIAGNOSIS — K219 Gastro-esophageal reflux disease without esophagitis: Secondary | ICD-10-CM | POA: Diagnosis not present

## 2021-07-27 DIAGNOSIS — F419 Anxiety disorder, unspecified: Secondary | ICD-10-CM | POA: Diagnosis not present

## 2021-07-27 DIAGNOSIS — B1920 Unspecified viral hepatitis C without hepatic coma: Secondary | ICD-10-CM | POA: Diagnosis not present

## 2021-07-27 DIAGNOSIS — Z87890 Personal history of sex reassignment: Secondary | ICD-10-CM | POA: Insufficient documentation

## 2021-07-27 DIAGNOSIS — F418 Other specified anxiety disorders: Secondary | ICD-10-CM | POA: Diagnosis not present

## 2021-07-27 HISTORY — PX: CARPAL TUNNEL RELEASE: SHX101

## 2021-07-27 SURGERY — CARPAL TUNNEL RELEASE
Anesthesia: Monitor Anesthesia Care | Site: Hand | Laterality: Right

## 2021-07-27 MED ORDER — 0.9 % SODIUM CHLORIDE (POUR BTL) OPTIME
TOPICAL | Status: DC | PRN
  Administered 2021-07-27: 500 mL

## 2021-07-27 MED ORDER — ACETAMINOPHEN 500 MG PO TABS
1000.0000 mg | ORAL_TABLET | Freq: Once | ORAL | Status: AC
  Administered 2021-07-27: 1000 mg via ORAL

## 2021-07-27 MED ORDER — ONDANSETRON HCL 4 MG/2ML IJ SOLN
INTRAMUSCULAR | Status: DC | PRN
  Administered 2021-07-27: 4 mg via INTRAVENOUS

## 2021-07-27 MED ORDER — ACETAMINOPHEN 500 MG PO TABS
ORAL_TABLET | ORAL | Status: AC
  Filled 2021-07-27: qty 2

## 2021-07-27 MED ORDER — OXYCODONE-ACETAMINOPHEN 5-325 MG PO TABS: 1.0000 | ORAL_TABLET | Freq: Four times a day (QID) | ORAL | 0 refills | Status: AC | PRN

## 2021-07-27 MED ORDER — OXYCODONE HCL 5 MG PO TABS
ORAL_TABLET | ORAL | Status: AC
Start: 2021-07-27 — End: ?
  Filled 2021-07-27: qty 1

## 2021-07-27 MED ORDER — OXYCODONE HCL 5 MG PO TABS
5.0000 mg | ORAL_TABLET | Freq: Once | ORAL | Status: AC | PRN
  Administered 2021-07-27: 5 mg via ORAL

## 2021-07-27 MED ORDER — OXYCODONE HCL 5 MG/5ML PO SOLN: 5.0000 mg | Freq: Once | ORAL | Status: AC | PRN

## 2021-07-27 MED ORDER — LIDOCAINE HCL (PF) 1 % IJ SOLN
INTRAMUSCULAR | Status: DC | PRN
  Administered 2021-07-27: 5 mL

## 2021-07-27 MED ORDER — MIDAZOLAM HCL 2 MG/2ML IJ SOLN: 0.5000 mg | Freq: Once | INTRAMUSCULAR | Status: DC | PRN

## 2021-07-27 MED ORDER — MIDAZOLAM HCL 2 MG/2ML IJ SOLN
INTRAMUSCULAR | Status: AC
  Filled 2021-07-27: qty 2

## 2021-07-27 MED ORDER — PROPOFOL 500 MG/50ML IV EMUL
INTRAVENOUS | Status: DC | PRN
  Administered 2021-07-27: 75 ug/kg/min via INTRAVENOUS

## 2021-07-27 MED ORDER — MEPERIDINE HCL 25 MG/ML IJ SOLN: 6.2500 mg | INTRAMUSCULAR | Status: DC | PRN

## 2021-07-27 MED ORDER — FENTANYL CITRATE (PF) 100 MCG/2ML IJ SOLN
INTRAMUSCULAR | Status: AC
  Filled 2021-07-27: qty 2

## 2021-07-27 MED ORDER — LIDOCAINE HCL (CARDIAC) PF 100 MG/5ML IV SOSY
PREFILLED_SYRINGE | INTRAVENOUS | Status: DC | PRN
  Administered 2021-07-27: 40 mg via INTRAVENOUS

## 2021-07-27 MED ORDER — BACITRACIN ZINC 500 UNIT/GM EX OINT
TOPICAL_OINTMENT | CUTANEOUS | Status: DC | PRN
  Administered 2021-07-27: 1 via TOPICAL

## 2021-07-27 MED ORDER — LACTATED RINGERS IV SOLN: INTRAVENOUS | Status: DC

## 2021-07-27 MED ORDER — FENTANYL CITRATE (PF) 100 MCG/2ML IJ SOLN: 25.0000 ug | INTRAMUSCULAR | Status: DC | PRN

## 2021-07-27 MED ORDER — FENTANYL CITRATE (PF) 100 MCG/2ML IJ SOLN
INTRAMUSCULAR | Status: DC | PRN
  Administered 2021-07-27: 25 ug via INTRAVENOUS

## 2021-07-27 MED ORDER — BUPIVACAINE HCL (PF) 0.5 % IJ SOLN
INTRAMUSCULAR | Status: DC | PRN
  Administered 2021-07-27: 5 mL

## 2021-07-27 MED ORDER — MIDAZOLAM HCL 2 MG/2ML IJ SOLN
INTRAMUSCULAR | Status: DC | PRN
  Administered 2021-07-27: 2 mg via INTRAVENOUS

## 2021-07-27 MED ORDER — PROPOFOL 10 MG/ML IV BOLUS
INTRAVENOUS | Status: DC | PRN
Start: 2021-07-27 — End: 2021-07-27
  Administered 2021-07-27 (×2): 10 mg via INTRAVENOUS
  Administered 2021-07-27: 20 mg via INTRAVENOUS

## 2021-07-27 SURGICAL SUPPLY — 28 items
BLADE SURG 15 STRL LF DISP TIS (BLADE) ×1 IMPLANT
BLADE SURG 15 STRL SS (BLADE) ×2
BNDG CMPR 9X4 STRL LF SNTH (GAUZE/BANDAGES/DRESSINGS) ×1
BNDG ELASTIC 4X5.8 VLCR STR LF (GAUZE/BANDAGES/DRESSINGS) ×2 IMPLANT
BNDG ESMARK 4X9 LF (GAUZE/BANDAGES/DRESSINGS) ×2 IMPLANT
COVER BACK TABLE 60X90IN (DRAPES) ×2 IMPLANT
CUFF TOURN SGL QUICK 18X4 (TOURNIQUET CUFF) ×2 IMPLANT
DRAPE EXTREMITY T 121X128X90 (DISPOSABLE) ×2 IMPLANT
DRSG EMULSION OIL 3X3 NADH (GAUZE/BANDAGES/DRESSINGS) ×2 IMPLANT
GAUZE 4X4 16PLY ~~LOC~~+RFID DBL (SPONGE) ×2 IMPLANT
GAUZE SPONGE 4X4 12PLY STRL (GAUZE/BANDAGES/DRESSINGS) ×2 IMPLANT
GAUZE SPONGE 4X4 12PLY STRL LF (GAUZE/BANDAGES/DRESSINGS) ×1 IMPLANT
GLOVE SURG UNDER POLY LF SZ7.5 (GLOVE) ×2 IMPLANT
GOWN STRL REUS W/TWL LRG LVL3 (GOWN DISPOSABLE) ×2 IMPLANT
HIBICLENS CHG 4% 4OZ BTL (MISCELLANEOUS) ×2 IMPLANT
KIT TURNOVER CYSTO (KITS) ×2 IMPLANT
KNIFE CARPAL TUNNEL (BLADE) ×2 IMPLANT
NEEDLE HYPO 22GX1.5 SAFETY (NEEDLE) ×2 IMPLANT
NS IRRIG 500ML POUR BTL (IV SOLUTION) ×2 IMPLANT
PACK BASIN DAY SURGERY FS (CUSTOM PROCEDURE TRAY) ×2 IMPLANT
PAD CAST 4YDX4 CTTN HI CHSV (CAST SUPPLIES) ×1 IMPLANT
PADDING CAST COTTON 4X4 STRL (CAST SUPPLIES) ×2
SUT ETHILON 4 0 PS 2 18 (SUTURE) ×2 IMPLANT
SYR 10ML LL (SYRINGE) ×2 IMPLANT
SYR BULB EAR ULCER 3OZ GRN STR (SYRINGE) ×2 IMPLANT
TOWEL OR 17X26 10 PK STRL BLUE (TOWEL DISPOSABLE) ×2 IMPLANT
TRAY DSU PREP LF (CUSTOM PROCEDURE TRAY) ×2 IMPLANT
UNDERPAD 30X36 HEAVY ABSORB (UNDERPADS AND DIAPERS) ×2 IMPLANT

## 2021-07-27 NOTE — Interval H&P Note (Signed)
History and Physical Interval Note: ? ?07/27/2021 ?11:43 AM ? ?Anthony Buck  has presented today for surgery, with the diagnosis of carpal tunnel syndrome.  The various methods of treatment have been discussed with the patient and family. After consideration of risks, benefits and other options for treatment, the patient has consented to  Procedure(s) with comments: ?CARPAL TUNNEL RELEASE,right (Right) - with local as a surgical intervention.  The patient's history has been reviewed, patient examined, no change in status, stable for surgery.  I have reviewed the patient's chart and labs.  Questions were answered to the patient's satisfaction.   ? ? ?[name] ? ? ?

## 2021-07-27 NOTE — Transfer of Care (Signed)
Immediate Anesthesia Transfer of Care Note ? ?Patient: Anthony Buck ? ?Procedure(s) Performed: CARPAL TUNNEL RELEASE,right (Right: Hand) ? ?Patient Location: Short Stay ? ?Anesthesia Type:MAC ? ?Level of Consciousness: awake, alert , oriented and patient cooperative ? ?Airway & Oxygen Therapy: Patient Spontanous Breathing ? ?Post-op Assessment: Report given to RN and Post -op Vital signs reviewed and stable ? ?Post vital signs: Reviewed and stable ? ?Last Vitals:  ?Vitals Value Taken Time  ?BP    ?Temp    ?Pulse    ?Resp    ?SpO2    ? ? ?Last Pain:  ?Vitals:  ? 07/27/21 1133  ?TempSrc: Oral  ?PainSc: 0-No pain  ?   ? ?  ? ?Complications: No notable events documented. ?

## 2021-07-27 NOTE — Anesthesia Preprocedure Evaluation (Signed)
Anesthesia Evaluation  ?Patient identified by MRN, date of birth, ID band ?Patient awake ? ? ? ?Reviewed: ?Allergy & Precautions, NPO status , Patient's Chart, lab work & pertinent test results ? ?History of Anesthesia Complications ?Negative for: history of anesthetic complications ? ?Airway ?Mallampati: I ? ?TM Distance: >3 FB ?Neck ROM: Full ? ? ? Dental ? ?(+) Dental Advisory Given ?  ?Pulmonary ?COPD (last inhaler needed a few days ago ),  COPD inhaler, former smoker,  ?  ?breath sounds clear to auscultation ? ? ? ? ? ? Cardiovascular ?negative cardio ROS ? ? ?Rhythm:Regular Rate:Normal ? ? ?  ?Neuro/Psych ?Anxiety Depression negative neurological ROS ?   ? GI/Hepatic ?GERD  Controlled and Medicated,(+) Hepatitis -, C  ?Endo/Other  ?negative endocrine ROS ? Renal/GU ?negative Renal ROS  ? ?  ?Musculoskeletal ? ? Abdominal ?  ?Peds ? Hematology ?negative hematology ROS ?(+)   ?Anesthesia Other Findings ? ? Reproductive/Obstetrics ? ?  ? ? ? ? ? ? ? ? ? ? ? ? ? ?  ?  ? ? ? ? ? ? ? ? ?Anesthesia Physical ?Anesthesia Plan ? ?ASA: 2 ? ?Anesthesia Plan: MAC  ? ?Post-op Pain Management: Tylenol PO (pre-op)*  ? ?Induction:  ? ?PONV Risk Score and Plan: 1 and Ondansetron and Treatment may vary due to age or medical condition ? ?Airway Management Planned: Natural Airway and Simple Face Mask ? ?Additional Equipment: None ? ?Intra-op Plan:  ? ?Post-operative Plan:  ? ?Informed Consent: I have reviewed the patients History and Physical, chart, labs and discussed the procedure including the risks, benefits and alternatives for the proposed anesthesia with the patient or authorized representative who has indicated his/her understanding and acceptance.  ? ? ? ?Dental advisory given ? ?Plan Discussed with: CRNA and Surgeon ? ?Anesthesia Plan Comments:   ? ? ? ? ? ? ?Anesthesia Quick Evaluation ? ?

## 2021-07-27 NOTE — Discharge Instructions (Addendum)
?Orthopaedic Hand Surgery Discharge Instructions ? ?WEIGHT BEARING STATUS: Non weight bearing on operative extremity ? ?INCISION CARE: Keep dressing over your incision clean and dry until 5 days after surgery. You may shower by placing a waterproof covering over your dressing. Once dressing is removed, you may allow water to run over the incision and then place Band-Aids over incision. Do not scrub your incision or apply creams/lotions. Do not submerge your incision or swim for 3 weeks after surgery. Contact your surgeon or primary care doctor if you develop redness or drainage from your incision.  ? ?PAIN CONTROL: First line medications for post operative pain control are Tylenol (acetaminophen) and Motrin (ibuprofen) if you are able to take these medications. If you have been prescribed a medication these can be taken as breakthrough pain medications. Please note that some narcotic pain medication has acetaminophen added and you should never consume more than 4,'000mg'$  of acetaminophen in 24-hour period. Please note that if you are given Toradol (ketorolac) you should not take similar medications such as ibuprofen or naproxen. ? ?DISCHARGE MEDICATIONS: If you have been prescribed medication it was sent electronically to your pharmacy. No changes have been made to your home medications. ? ?ICE/ELEVATION: Ice and elevate your injured extremity as needed. Avoid direct contact of ice with skin.  ? ?BANDAGE FEELS TOO TIGHT: If your bandage feels too tight, first make sure you are elevating your fingers as much as possible. The outer layer of the bandage can be unwrapped and reapplied more loosely. If no improvement, you may carefully cut the inner layer longitudinally until the pressure has resolved and then rewrap the outer layer. If you are not comfortable with these instructions, please call the office and the bandage can be changed for you.  ? ?FOLLOW UP: You will be called after surgery with an appointment date and  time, however if you have not received a phone call within 3 days, please call during regular office hours at [phone] to schedule a post operative appointment. ? ?Please Seek Medical Attention if: ?Call MD for: pain or pressure in chest, jaw, arm, back, neck  ?Call MD for: temperature greater than 101 F for more than 24 hrs ?Call MD for: difficulty breathing ?Call MD for: incision redness, bleeding, drainage  ?Call MD for: palpitations or feeling that the heart is racing  ?Call MD for: increased swelling in arm, leg, ankle, or abdomen  ?Call MD for: lightheadedness, dizziness, fainting ?Call 911 or go to ER for any medical emergency if you are not able to get in touch with your doctor ? ? ?J. Sable Feil, MD ?Orthopaedic Hand Surgeon ?[facility] ?Office number: [phone] ?[address] ?[place], [place] [address] ? ? ?Post Anesthesia Home Care Instructions ? ?Activity: ?Get plenty of rest for the remainder of the day. A responsible adult should stay with you for 24 hours following the procedure.  ?For the next 24 hours, DO NOT: ?-Drive a car ?-Paediatric nurse ?-Drink alcoholic beverages ?-Take any medication unless instructed by your physician ?-Make any legal decisions or sign important papers. ? ?Meals: ?Start with liquid foods such as gelatin or soup. Progress to regular foods as tolerated. Avoid greasy, spicy, heavy foods. If nausea and/or vomiting occur, drink only clear liquids until the nausea and/or vomiting subsides. Call your physician if vomiting continues. ? ?Special Instructions/Symptoms: ?Your throat may feel dry or sore from the anesthesia or the breathing tube placed in your throat during surgery. If this causes discomfort, gargle with warm  salt water. The discomfort should disappear within 24 hours. ? ? ?   ?

## 2021-07-27 NOTE — Anesthesia Postprocedure Evaluation (Signed)
Anesthesia Post Note ? ?Patient: Anthony Buck ? ?Procedure(s) Performed: CARPAL TUNNEL RELEASE,right (Right: Hand) ? ?  ? ?Patient location during evaluation: Phase II ?Anesthesia Type: MAC ?Level of consciousness: awake and alert, oriented and patient cooperative ?Pain management: pain level controlled ?Vital Signs Assessment: post-procedure vital signs reviewed and stable ?Respiratory status: spontaneous breathing, nonlabored ventilation and respiratory function stable ?Cardiovascular status: blood pressure returned to baseline and stable ?Postop Assessment: able to ambulate and adequate PO intake ?Anesthetic complications: no ? ? ?No notable events documented. ? ?Last Vitals:  ?Vitals:  ? 07/27/21 1133  ?BP: (!) 146/106  ?Pulse: 65  ?Resp: 20  ?Temp: 37.1 ?C  ?SpO2: 99%  ?  ?Last Pain:  ?Vitals:  ? 07/27/21 1133  ?TempSrc: Oral  ?PainSc: 0-No pain  ? ? ?  ?  ?  ?  ?  ?  ? ?Lavra Imler,E. Skilynn Durney ? ? ? ? ?

## 2021-07-27 NOTE — Op Note (Signed)
OPERATIVE NOTE ? ?DATE OF PROCEDURE: 07/27/2021 ? ?SURGEON: Izell Oglethorpe, MD ? ?PREOPERATIVE DIAGNOSIS: Right Carpal Tunnel Syndrome ? ?POSTOPERATIVE DIAGNOSIS: Same ? ?NAME OF PROCEDURE: Right Carpal Tunnel Release ? ?ANESTHESIA: Local + MAC ? ?SKIN PREPARATION: Hibiclens ? ?ESTIMATED BLOOD LOSS: Minimal ? ?IMPLANTS: none ? ?INDICATIONS:  Anthony Buck is a 66 y.o. male who presents with right carpal tunnel syndrome, refractory to nonoperative treatment. The patient has decided to proceed with surgical intervention.  Risks, benefits and alternatives of operative management were discussed including, but not limited to, risks of anesthesia complications, infection, pain, persistent symptoms, stiffness, need for future surgery.  The patient understands, agrees and elects to proceed with surgery.   ? ?DESCRIPTION OF PROCEDURE: The patient was placed in the usual supine position and the right upper extremity was prepped and draped in normal sterile fashion.  After local block anesthetic to the right hand and wrist, a standard 1.5 cm incision was made in the midpalm.  This was carried down through the subcutaneous tissues and palmar fascia to the transverse carpal ligament.  The distal one-half of the transverse carpal ligament was incised longitudinally under direct vision using a 15 blade.  The carpal tunnel release guide was then placed under direct vision on the transverse carpal ligament and slid proximally.  The guide was palpated into appropriate alignment longitudinally.  Contact with the transverse carpal ligament was maintained throughout passing.  The blade was engaged into the guide and the remaining portion of the transverse carpal ligament released completely.  No other abnormalities were noted.  The wound was copiously irrigated and the skin closed using horizontal mattress 4-0 nylon sutures.  A light bulky dressing was placed.  The patient tolerated the procedure well and returned to the recovery room in stable  condition.  I was present for the entire surgical procedure. ? ? ?Anthony Holmes, MD  ?

## 2021-07-27 NOTE — Addendum Note (Signed)
Addendum  created 07/27/21 1411 by Georgeanne Nim, CRNA  ? Intraprocedure Meds edited  ?  ?

## 2021-07-28 ENCOUNTER — Encounter (HOSPITAL_BASED_OUTPATIENT_CLINIC_OR_DEPARTMENT_OTHER): Payer: Self-pay | Admitting: Orthopedic Surgery

## 2021-08-03 ENCOUNTER — Telehealth (HOSPITAL_BASED_OUTPATIENT_CLINIC_OR_DEPARTMENT_OTHER): Payer: Self-pay

## 2021-08-03 NOTE — Telephone Encounter (Signed)
Pt called and asked when he could return to "doing things like golfing and riding a motorcycle" after surgery. States wrist feels fine, no pain. Reviewed AVS and informed pt he is to be non weight bearing until follow up according to written instructions. Pt states he has follow up appt next Monday. Instructed him to follow up with surgeon's office if he has additional questions or wants to request clearance to return to weight bearing sooner than instructed and provided office number for [facility] [phone]. Pt verbalized understanding of instructions and had no further questions at time of call. ?

## 2021-09-09 ENCOUNTER — Other Ambulatory Visit: Payer: Self-pay | Admitting: Nurse Practitioner

## 2021-09-09 DIAGNOSIS — D376 Neoplasm of uncertain behavior of liver, gallbladder and bile ducts: Secondary | ICD-10-CM

## 2021-09-09 DIAGNOSIS — K7469 Other cirrhosis of liver: Secondary | ICD-10-CM

## 2021-10-02 ENCOUNTER — Ambulatory Visit
Admission: RE | Admit: 2021-10-02 | Discharge: 2021-10-02 | Disposition: A | Discharge status: Home / Self Care | Payer: Medicare Other | Source: Ambulatory Visit | Attending: Nurse Practitioner | Admitting: Nurse Practitioner

## 2021-10-02 DIAGNOSIS — K7469 Other cirrhosis of liver: Secondary | ICD-10-CM

## 2021-10-02 DIAGNOSIS — D376 Neoplasm of uncertain behavior of liver, gallbladder and bile ducts: Secondary | ICD-10-CM

## 2021-10-02 MED ORDER — GADOBENATE DIMEGLUMINE 529 MG/ML IV SOLN
15.0000 mL | Freq: Once | INTRAVENOUS | Status: AC | PRN
  Administered 2021-10-02: 15 mL via INTRAVENOUS

## 2021-11-04 ENCOUNTER — Ambulatory Visit
Admission: RE | Admit: 2021-11-04 | Discharge: 2021-11-04 | Disposition: A | Discharge status: Home / Self Care | Payer: Medicare Other | Source: Ambulatory Visit | Attending: Urology | Admitting: Urology

## 2021-11-04 DIAGNOSIS — R972 Elevated prostate specific antigen [PSA]: Secondary | ICD-10-CM

## 2021-11-04 MED ORDER — GADOBENATE DIMEGLUMINE 529 MG/ML IV SOLN
15.0000 mL | Freq: Once | INTRAVENOUS | Status: AC | PRN
  Administered 2021-11-04: 15 mL via INTRAVENOUS

## 2022-03-15 ENCOUNTER — Other Ambulatory Visit: Payer: Self-pay | Admitting: Nurse Practitioner

## 2022-03-15 DIAGNOSIS — K7469 Other cirrhosis of liver: Secondary | ICD-10-CM

## 2022-04-14 ENCOUNTER — Ambulatory Visit
Admission: RE | Admit: 2022-04-14 | Discharge: 2022-04-14 | Disposition: A | Discharge status: Home / Self Care | Payer: Medicare Other | Source: Ambulatory Visit | Attending: Nurse Practitioner | Admitting: Nurse Practitioner

## 2022-04-14 DIAGNOSIS — K7469 Other cirrhosis of liver: Secondary | ICD-10-CM

## 2022-09-10 IMAGING — MR MR ABDOMEN WO/W CM
11 of 19 series · 23 of 48 positions shown · IV contrast (15ml Multihance)
Comparison: Abdominal MRI 09/26/2020.

CLINICAL DATA: 66-year-old male with history of cirrhosis.
Follow-up for liver lesions noted on prior examinations.

EXAM:
MRI ABDOMEN WITHOUT AND WITH CONTRAST
TECHNIQUE: Multiplanar multisequence MR imaging of the abdomen was performed
both before and after the administration of intravenous contrast.
CONTRAST:  15mL MULTIHANCE GADOBENATE DIMEGLUMINE 529 MG/ML IV SOLN

[Series 3: cor haste · coronal · 5.0mm · 0.72mm/px · 1 of 39 slices shown]
[im 1/39]
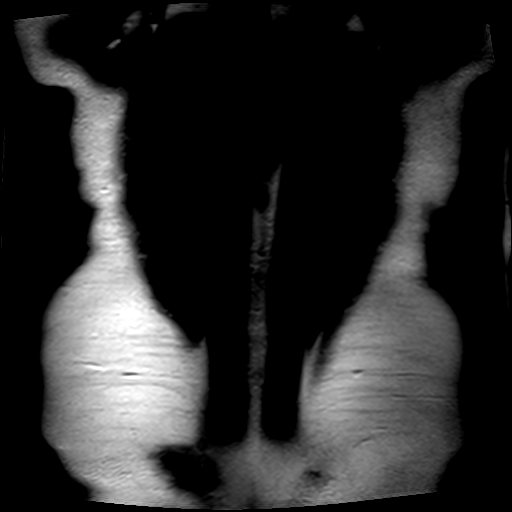

[Series 4: axial haste · axial · 6.0mm · 0.74mm/px · 1 of 38 slices shown]
[im 1/38]
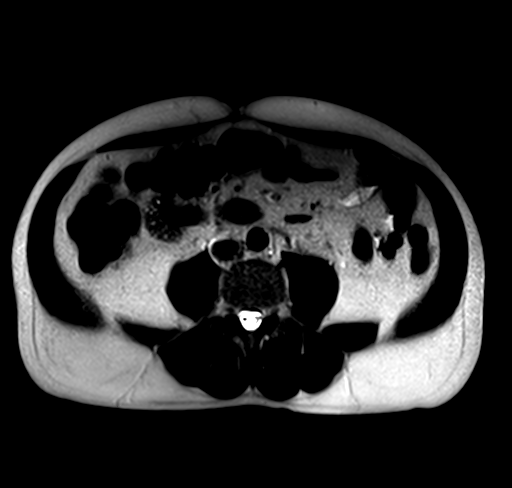

[Series 5: T1 · axial · 6.0mm · 0.74mm/px · z∈[-122,+122]mm · 2 of 76 slices shown]
[im 1/76]
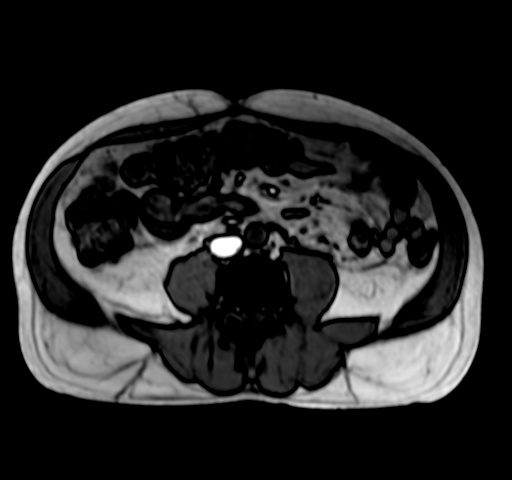
[im 76/76]
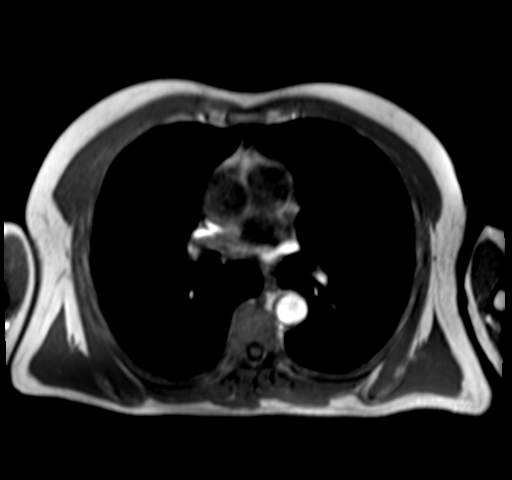

[Series 6: bSSFP · axial · 4.0mm · 0.74mm/px · z∈[-120,+120]mm · 2 of 61 slices shown]
[im 1/61]
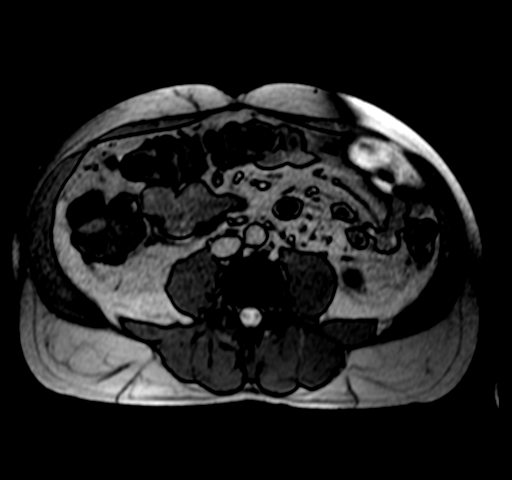
[im 61/61]
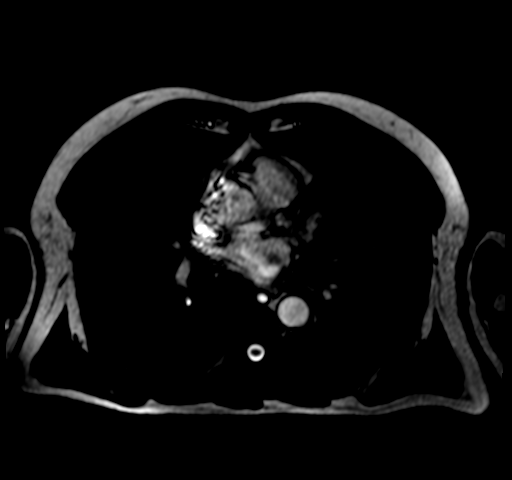

[Series 7: T2 fat-sat · axial · 6.0mm · 1.19mm/px · 1 of 40 slices shown]
[im 1/40]
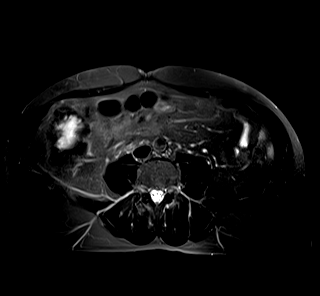

[Series 8: [id number]_trig · axial · 6.0mm · 1.98mm/px · z∈[-123,+134]mm · 4 of 120 slices shown]
[im 1/120]
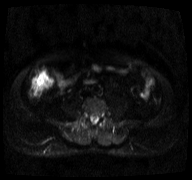
[im 40/120]
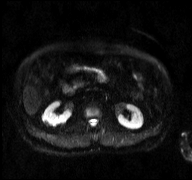
[im 80/120]
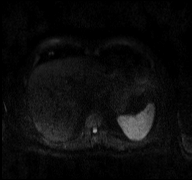
[im 120/120]
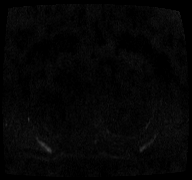

[Series 9: [id number]_trig_adc · axial · 6.0mm · 1.98mm/px · 1 of 40 slices shown]
[im 1/40]
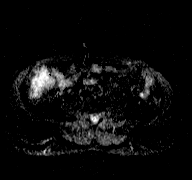

[Series 10: T1 dynamic · axial · non-contrast · 3.0mm · 0.74mm/px · z∈[-119,+118]mm · 3 of 80 slices shown]
[im 1/80]
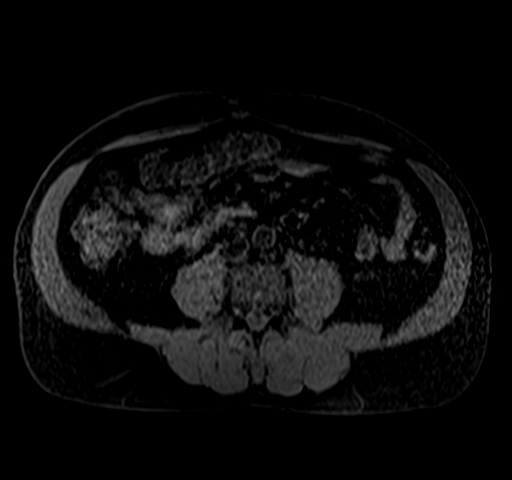
[im 40/80]
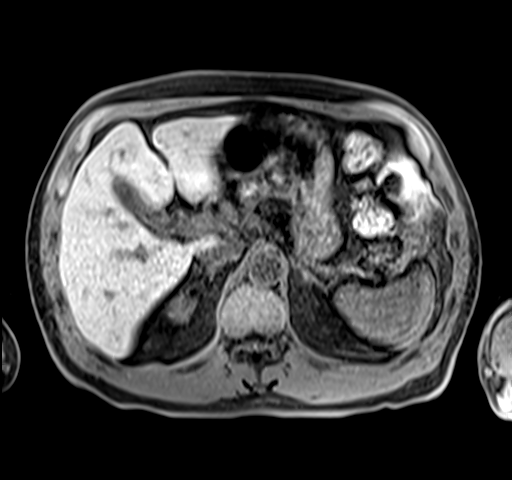
[im 80/80]
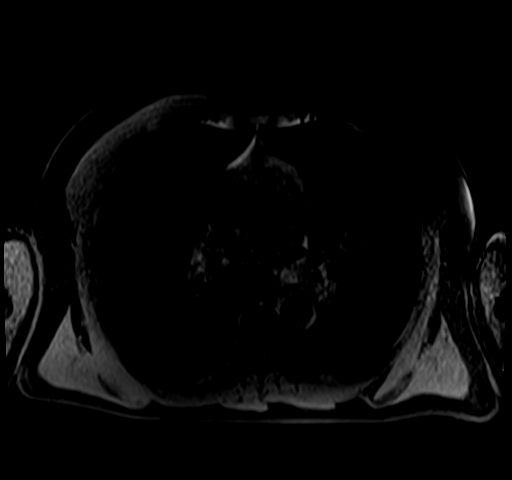

[Series 11: T1 dynamic post-contrast · axial · 3.0mm · 0.74mm/px · z∈[-119,+118]mm · 3 of 80 slices shown (1 of 3)]
[im 1/80]
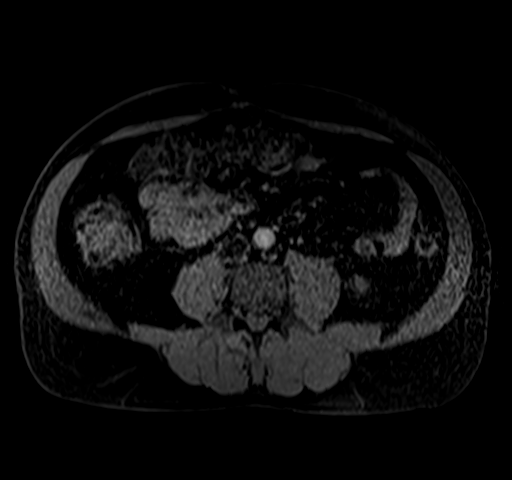
[im 40/80]
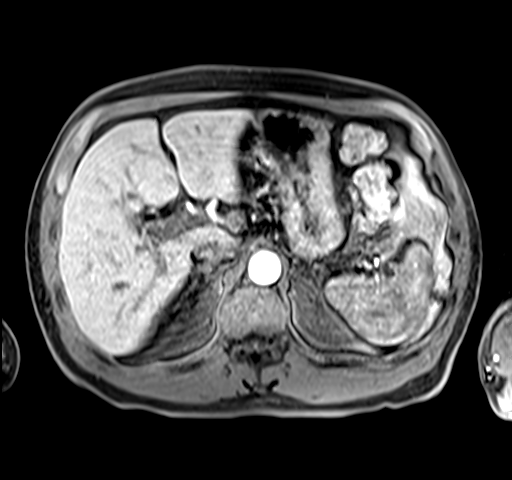
[im 80/80]
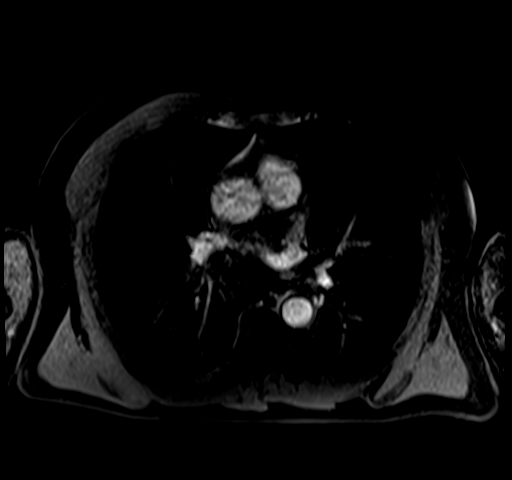

[Series 12: T1 dynamic post-contrast · axial · 3.0mm · 0.74mm/px · z∈[-119,+118]mm · 3 of 80 slices shown (2 of 3)]
[im 1/80]
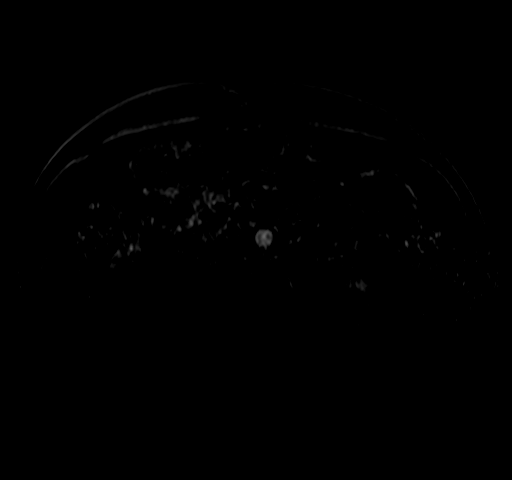
[im 40/80]
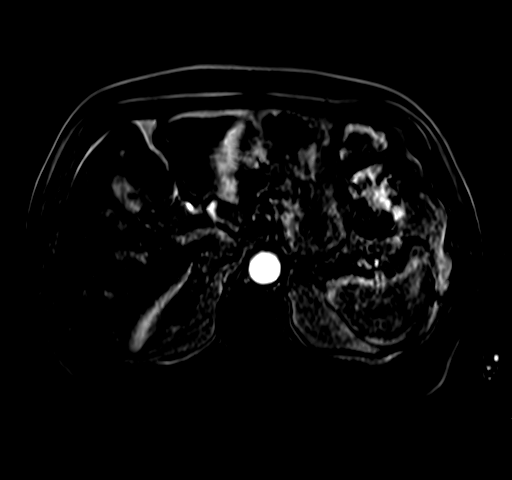
[im 80/80]
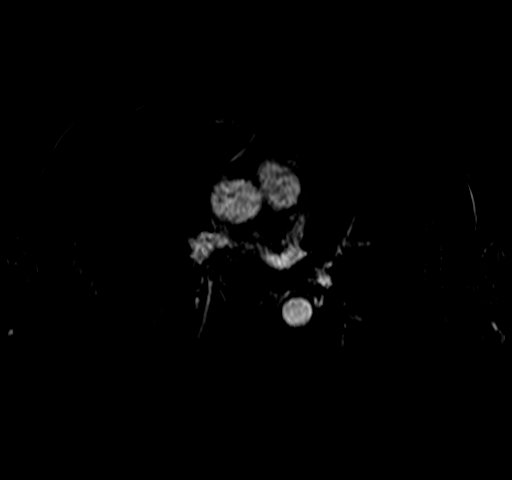

[Series 13: T1 dynamic post-contrast · axial · 3.0mm · 0.74mm/px · z∈[-119,-2]mm · 2 of 80 slices shown (3 of 3)]
[im 1/80]
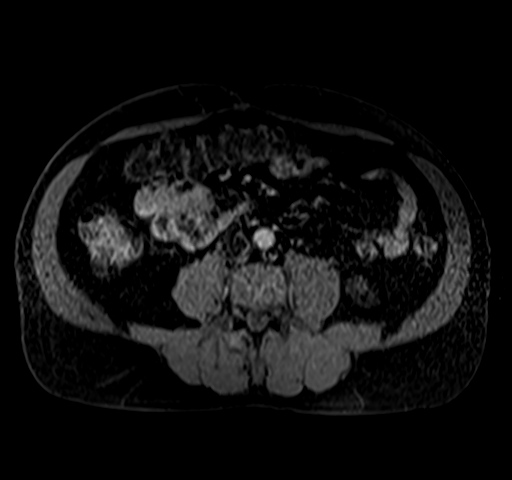
[im 40/80]
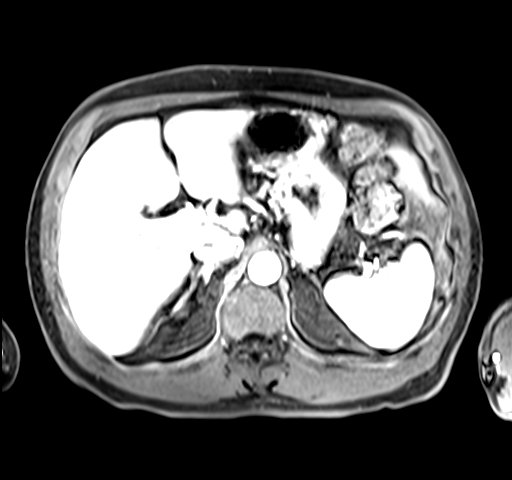

[23 of 48 positions shown; findings below may reference images not displayed]

FINDINGS: Lower chest: Unremarkable.

Hepatobiliary: Mild diffuse loss of signal intensity throughout the
hepatic parenchyma on out of phase dual echo images, indicative of a
background of mild hepatic steatosis. In the central aspect of the
right lobe of the liver (axial image 35 of series 10) there is a
well-defined 1.6 x 0.7 cm T1 hypointense, T2 hyperintense lesion
which demonstrates some early peripheral hyperenhancement with
progressive centripetal filling and retention of contrast material
matching that of blood pool on delayed imaging, diagnostic of a
cavernous hemangioma. No other suspicious hepatic lesions. No intra
or extrahepatic biliary ductal dilatation. Common bile duct measures
6 mm in the porta hepatis. No filling defect in the common bile duct
to suggest choledocholithiasis. Gallbladder is normal in appearance.

Pancreas: 6 mm T1 hypointense, T2 hyperintense, nonenhancing lesion
in the tail of the pancreas (axial image 22 of series 4), stable
compared to the prior study. No new suspicious appearing pancreatic
mass. No pancreatic ductal dilatation. No peripancreatic fluid
collections or inflammatory changes.

Spleen:  Unremarkable.

Adrenals/Urinary Tract: Bilateral kidneys and adrenal glands are
normal in appearance. No hydroureteronephrosis in the visualized
portions of the abdomen.

Stomach/Bowel: Visualized portions are unremarkable.

Vascular/Lymphatic: No aneurysm identified in the visualized
abdominal vasculature. No lymphadenopathy noted in the abdomen.

Other: No significant volume of ascites noted in the visualized
portions of the peritoneal cavity.

Musculoskeletal: No aggressive appearing osseous lesions are noted
in the visualized portions of the skeleton.
IMPRESSION: 1. The lesion of concern in the central aspect of the right lobe of
the liver has imaging characteristics diagnostic of a benign
cavernous hemangioma.
2. Other previously suspected hypervascular areas in the liver have
resolved, presumably benign perfusion anomalies on the prior study.
3. Mild hepatic steatosis.
4. 6 mm cystic lesion in the tail of the pancreas, stable in
retrospect compared to the prior study, nonspecific, but
statistically likely a benign lesion such as a tiny pancreatic
pseudocyst. Repeat abdominal MRI with and without IV gadolinium with
MRCP is recommended in 1 year to ensure the stability of this
finding. This recommendation follows ACR consensus guidelines:
Management of Incidental Pancreatic Cysts: A White Paper of the ACR
Incidental Findings Committee. [HOSPITAL] 2459;[DATE].

## 2022-09-13 ENCOUNTER — Other Ambulatory Visit: Payer: Self-pay

## 2022-09-13 DIAGNOSIS — D1803 Hemangioma of intra-abdominal structures: Secondary | ICD-10-CM

## 2022-09-13 DIAGNOSIS — K863 Pseudocyst of pancreas: Secondary | ICD-10-CM

## 2022-09-13 DIAGNOSIS — K7460 Unspecified cirrhosis of liver: Secondary | ICD-10-CM

## 2022-09-21 ENCOUNTER — Other Ambulatory Visit: Payer: Self-pay | Admitting: Nurse Practitioner

## 2022-09-21 DIAGNOSIS — K7460 Unspecified cirrhosis of liver: Secondary | ICD-10-CM

## 2022-09-21 DIAGNOSIS — D1803 Hemangioma of intra-abdominal structures: Secondary | ICD-10-CM

## 2022-09-21 DIAGNOSIS — K863 Pseudocyst of pancreas: Secondary | ICD-10-CM

## 2022-10-28 ENCOUNTER — Other Ambulatory Visit: Payer: Medicare Other

## 2022-12-31 ENCOUNTER — Ambulatory Visit
Admission: RE | Admit: 2022-12-31 | Discharge: 2022-12-31 | Disposition: A | Discharge status: Home / Self Care | Payer: Medicare Other | Source: Ambulatory Visit | Attending: Nurse Practitioner | Admitting: Nurse Practitioner

## 2022-12-31 DIAGNOSIS — D1803 Hemangioma of intra-abdominal structures: Secondary | ICD-10-CM

## 2022-12-31 DIAGNOSIS — K7460 Unspecified cirrhosis of liver: Secondary | ICD-10-CM

## 2022-12-31 DIAGNOSIS — K863 Pseudocyst of pancreas: Secondary | ICD-10-CM

## 2023-01-17 ENCOUNTER — Other Ambulatory Visit: Payer: Self-pay | Admitting: Urology

## 2023-01-17 DIAGNOSIS — R972 Elevated prostate specific antigen [PSA]: Secondary | ICD-10-CM

## 2023-02-25 ENCOUNTER — Ambulatory Visit
Admission: RE | Admit: 2023-02-25 | Discharge: 2023-02-25 | Disposition: A | Discharge status: Home / Self Care | Payer: Medicare Other | Source: Ambulatory Visit | Attending: Urology | Admitting: Urology

## 2023-02-25 DIAGNOSIS — R972 Elevated prostate specific antigen [PSA]: Secondary | ICD-10-CM

## 2023-02-25 MED ORDER — GADOPICLENOL 0.5 MMOL/ML IV SOLN
8.0000 mL | Freq: Once | INTRAVENOUS | Status: AC | PRN
  Administered 2023-02-25: 8 mL via INTRAVENOUS

## 2023-03-18 ENCOUNTER — Other Ambulatory Visit: Payer: Self-pay | Admitting: Nurse Practitioner

## 2023-03-18 DIAGNOSIS — K7469 Other cirrhosis of liver: Secondary | ICD-10-CM

## 2023-03-18 DIAGNOSIS — D376 Neoplasm of uncertain behavior of liver, gallbladder and bile ducts: Secondary | ICD-10-CM

## 2023-08-09 ENCOUNTER — Ambulatory Visit
Admission: RE | Admit: 2023-08-09 | Discharge: 2023-08-09 | Disposition: A | Discharge status: Home / Self Care | Source: Ambulatory Visit | Attending: Nurse Practitioner

## 2023-08-09 DIAGNOSIS — K7469 Other cirrhosis of liver: Secondary | ICD-10-CM

## 2023-08-09 DIAGNOSIS — D376 Neoplasm of uncertain behavior of liver, gallbladder and bile ducts: Secondary | ICD-10-CM

## 2024-01-26 ENCOUNTER — Other Ambulatory Visit: Payer: Self-pay | Admitting: Nurse Practitioner

## 2024-01-26 DIAGNOSIS — K7469 Other cirrhosis of liver: Secondary | ICD-10-CM

## 2024-03-02 ENCOUNTER — Ambulatory Visit
Admission: RE | Admit: 2024-03-02 | Discharge: 2024-03-02 | Disposition: A | Discharge status: Home / Self Care | Source: Ambulatory Visit | Attending: Nurse Practitioner | Admitting: Nurse Practitioner

## 2024-03-02 DIAGNOSIS — K7469 Other cirrhosis of liver: Secondary | ICD-10-CM

## 2024-03-13 ENCOUNTER — Other Ambulatory Visit

## 2024-06-07 ENCOUNTER — Other Ambulatory Visit: Payer: Self-pay | Admitting: Nurse Practitioner

## 2024-06-07 DIAGNOSIS — K7469 Other cirrhosis of liver: Secondary | ICD-10-CM

## 2024-06-18 ENCOUNTER — Other Ambulatory Visit
